# Patient Record
Sex: Female | Born: 1980
Health system: Southern US, Community
[De-identification: ages and names within clinical notes are randomized; demographics above are authoritative.]

## PROBLEM LIST (undated history)

## (undated) DIAGNOSIS — N809 Endometriosis, unspecified: Secondary | ICD-10-CM

## (undated) DIAGNOSIS — Z8489 Family history of other specified conditions: Secondary | ICD-10-CM

## (undated) DIAGNOSIS — R001 Bradycardia, unspecified: Secondary | ICD-10-CM

## (undated) DIAGNOSIS — B2790 Infectious mononucleosis, unspecified without complication: Secondary | ICD-10-CM

## (undated) DIAGNOSIS — I959 Hypotension, unspecified: Secondary | ICD-10-CM

## (undated) DIAGNOSIS — I341 Nonrheumatic mitral (valve) prolapse: Secondary | ICD-10-CM

## (undated) DIAGNOSIS — F909 Attention-deficit hyperactivity disorder, unspecified type: Secondary | ICD-10-CM

## (undated) DIAGNOSIS — E042 Nontoxic multinodular goiter: Secondary | ICD-10-CM

## (undated) DIAGNOSIS — J45909 Unspecified asthma, uncomplicated: Secondary | ICD-10-CM

## (undated) DIAGNOSIS — E538 Deficiency of other specified B group vitamins: Secondary | ICD-10-CM

## (undated) DIAGNOSIS — J189 Pneumonia, unspecified organism: Secondary | ICD-10-CM

## (undated) DIAGNOSIS — R768 Other specified abnormal immunological findings in serum: Secondary | ICD-10-CM

## (undated) DIAGNOSIS — R7689 Other specified abnormal immunological findings in serum: Secondary | ICD-10-CM

## (undated) HISTORY — DX: Endometriosis, unspecified: N80.9

## (undated) HISTORY — DX: Infectious mononucleosis, unspecified without complication: B27.90

## (undated) HISTORY — PX: ENDOMETRIAL ABLATION: SHX621

## (undated) HISTORY — DX: Other specified abnormal immunological findings in serum: R76.89

## (undated) HISTORY — DX: Other specified abnormal immunological findings in serum: R76.8

---

## 2017-05-06 HISTORY — PX: PARTIAL HYSTERECTOMY: SHX80

## 2017-05-16 DIAGNOSIS — N809 Endometriosis, unspecified: Secondary | ICD-10-CM | POA: Insufficient documentation

## 2017-07-06 HISTORY — PX: NECK SURGERY: SHX720

## 2017-10-06 HISTORY — PX: APPENDECTOMY: SHX54

## 2017-10-06 HISTORY — PX: EXCISION OF ENDOMETRIOMA: SHX6473

## 2018-01-09 ENCOUNTER — Other Ambulatory Visit: Payer: Self-pay

## 2018-01-09 ENCOUNTER — Emergency Department (HOSPITAL_COMMUNITY): Admission: EM | Admit: 2018-01-09 | Discharge: 2018-01-09 | Discharge status: Against Medical Advice | Payer: 59

## 2018-01-09 NOTE — ED Notes (Signed)
Patient seen walking out lobby doors towards parking lot.

## 2018-11-17 ENCOUNTER — Encounter: Payer: Self-pay | Admitting: Internal Medicine

## 2019-05-15 ENCOUNTER — Encounter: Payer: Self-pay | Admitting: Endocrinology

## 2019-05-15 ENCOUNTER — Ambulatory Visit: Payer: BC Managed Care – PPO | Admitting: Endocrinology

## 2019-05-15 DIAGNOSIS — E559 Vitamin D deficiency, unspecified: Secondary | ICD-10-CM | POA: Diagnosis not present

## 2019-05-15 DIAGNOSIS — M542 Cervicalgia: Secondary | ICD-10-CM | POA: Diagnosis not present

## 2019-05-15 DIAGNOSIS — E042 Nontoxic multinodular goiter: Secondary | ICD-10-CM | POA: Insufficient documentation

## 2019-05-15 DIAGNOSIS — E069 Thyroiditis, unspecified: Secondary | ICD-10-CM | POA: Diagnosis not present

## 2019-05-15 NOTE — Patient Instructions (Addendum)
Please sign a release of information from [place].   Blood tests are requested for you today.  We'll let you know about the results.  Please see an ENT specialist.  you will receive a phone call, about a day and time for an appointment Let's recheck the ultrasound.  you will receive a phone call, about a day and time for an appointment, for this, also.

## 2019-05-15 NOTE — Progress Notes (Signed)
Subjective:    Patient ID: Isabella Singh, female    DOB: [birth year], 38 y.o.   MRN: [id number]  HPI Pt is referred by Dr Gevena Barre, for hypothyroidism.  Pt reports thyroiditis was found on a neck CT in 2019.  she has never been on prescribed thyroid hormone therapy.  she has never taken kelp or any other type of non-prescribed thyroid product.  She is not considering a pregnancy.  He has never had thyroid surgery, or XRT to the neck.  He has never been on amiodarone or lithium.   Pt was noted to have hypocalcemia in 2019.  A recheck was normal.  She takes vit-d, 7000-10,000 units/day.  She denies h/o the following: bariatric surgery, renal disease, seizures, pancreatitis, heart disease, cancer, osteoporosis, malabsorption, eating disorder, bony fx, ant neck surgery, and chelation rx.  She has moderately dry hair, and assoc fatigue.   She also says she was checked for a painful nodule at the left lat neck.  She requests f/u Past Medical History:  Diagnosis Date  . Elevated antinuclear antibody (ANA) level   . Endometriosis    sx 2008 (x1), 2018 (x2)  . [facility] Patient infection    hx    Past Surgical History:  Procedure Laterality Date  . APPENDECTOMY  10/2017  . EXCISION OF ENDOMETRIOMA  10/2017  . NECK SURGERY  07/2017   c5 c6  . PARTIAL HYSTERECTOMY  05/2017    Social History   Socioeconomic History  . Marital status: Married    Spouse name: Not on file  . Number of children: Not on file  . Years of education: Not on file  . Highest education level: Not on file  Occupational History  . Occupation: Electrical engineer  Social Needs  . Financial resource strain: Not on file  . Food insecurity:    Worry: Not on file    Inability: Not on file  . Transportation needs:    Medical: Not on file    Non-medical: Not on file  Tobacco Use  . Smoking status: Never Smoker  . Smokeless tobacco: Never Used  Substance and Sexual Activity  . Alcohol use: Never    Frequency: Never  . Drug  use: Never  . Sexual activity: Not on file  Lifestyle  . Physical activity:    Days per week: Not on file    Minutes per session: Not on file  . Stress: Not on file  Relationships  . Social connections:    Talks on phone: Not on file    Gets together: Not on file    Attends religious service: Not on file    Active member of club or organization: Not on file    Attends meetings of clubs or organizations: Not on file    Relationship status: Not on file  . Intimate partner violence:    Fear of current or ex partner: Not on file    Emotionally abused: Not on file    Physically abused: Not on file    Forced sexual activity: Not on file  Other Topics Concern  . Not on file  Social History Narrative  . Not on file    Current Outpatient Medications on File Prior to Visit  Medication Sig Dispense Refill  . ibuprofen (ADVIL) 600 MG tablet Prn    . UNABLE TO FIND Med Name: Daily Wellbeing for Women - 2 daily    . UNABLE TO FIND Med Name: Bone Strength - 2 daily    .  UNABLE TO FIND Med Name: l-glutathione 100 - 2 daily     No current facility-administered medications on file prior to visit.     Allergies  Allergen Reactions  . Cyclobenzaprine Rash  . Sulfa Antibiotics     Family History  Problem Relation Age of Onset  . Thyroid disease Neg Hx     BP 110/70 (BP Location: Left Arm, Patient Position: Sitting, Cuff Size: Normal)   Pulse 72   Temp 98.3 F (36.8 C) (Oral)   Wt 157 lb (71.2 kg)   SpO2 97%     Review of Systems She has generalized weakness.  denies depression, muscle cramps, sob, constipation, blurry vision, myalgias, rhinorrhea, easy bruising, and syncope.  She has chronic slight pain at the left lat neck.  She has weight gain, cold intolerance, foot numbness, dry skin, and difficulty with concentration.      Objective:   Physical Exam VS: see vs page GEN: no distress HEAD: head: no deformity eyes: no periorbital swelling, no proptosis external nose and  ears are normal mouth: no lesion seen.   NECK: healed scar is present (C-spine procedure).  thyroid is slightly enlarged, with irreg surface, but no palpable nodule.   CHEST WALL: no deformity LUNGS: clear to auscultation CV: reg rate and rhythm, no murmur ABD: abdomen is soft, nontender.  no hepatosplenomegaly.  not distended.  no hernia MUSCULOSKELETAL: muscle bulk and strength are grossly normal.  no obvious joint swelling.  gait is normal and steady EXTEMITIES: no deformity.  no ulcer on the feet.  feet are of normal color and temp.  no edema PULSES: dorsalis pedis intact bilat.  no carotid bruit NEURO:  cn 2-12 grossly intact.   readily moves all 4's.  sensation is intact to touch on the feet SKIN:  Normal texture and temperature.  No rash or suspicious lesion is visible.   NODES:  None palpable at the neck PSYCH: alert, well-oriented.  Does not appear anxious nor depressed.  outside test results are reviewed: [place] (2019): heterogeneous thyroid but no nodule  08/15/18:  25-OH vit-D=41  PTH=19 TSH=0.64  I have reviewed outside records, and summarized: Pt was noted to have abnormal thyroid on [place], and referred to endocrinologist.  She was also noted to have hypocalcemia.  Biotin was stopped      Assessment & Plan:  Thyroiditis, per [place], new to me.  She is at risk for abnormal thyroid function.  Therefore, she should have annual TFT and PE of the neck Hypocalcemia, uncertain etiology.  Neck nodule, as reported by pt.    Patient Instructions  Please sign a release of information from [place].   Blood tests are requested for you today.  We'll let you know about the results.  Please see an ENT specialist.  you will receive a phone call, about a day and time for an appointment Let's recheck the ultrasound.  you will receive a phone call, about a day and time for an appointment, for this, also.

## 2019-05-16 ENCOUNTER — Encounter: Payer: Self-pay | Admitting: Endocrinology

## 2019-05-16 DIAGNOSIS — R5382 Chronic fatigue, unspecified: Secondary | ICD-10-CM | POA: Diagnosis not present

## 2019-05-16 DIAGNOSIS — M2550 Pain in unspecified joint: Secondary | ICD-10-CM | POA: Diagnosis not present

## 2019-05-16 DIAGNOSIS — D8989 Other specified disorders involving the immune mechanism, not elsewhere classified: Secondary | ICD-10-CM | POA: Diagnosis not present

## 2019-05-16 LAB — BASIC METABOLIC PANEL
BUN: 13 mg/dL (ref 6–23)
CO2: 22 mEq/L (ref 19–32)
Calcium: 9.5 mg/dL (ref 8.4–10.5)
Chloride: 106 mEq/L (ref 96–112)
Creatinine, Ser: 0.71 mg/dL (ref 0.40–1.20)
GFR: 91.68 mL/min (ref >60.00)
Glucose, Bld: 95 mg/dL (ref 70–99)
Potassium: 3.9 mEq/L (ref 3.5–5.1)
Sodium: 138 mEq/L (ref 135–145)

## 2019-05-16 LAB — TSH: TSH: 0.73 u[IU]/mL (ref 0.35–4.50)

## 2019-05-16 LAB — T3, FREE: T3, Free: 3.4 pg/mL (ref 2.3–4.2)

## 2019-05-16 LAB — T4, FREE: Free T4: 0.72 ng/dL (ref 0.60–1.60)

## 2019-05-16 LAB — VITAMIN D 25 HYDROXY (VIT D DEFICIENCY, FRACTURES): VITD: 58.24 ng/mL (ref 30.00–100.00)

## 2019-05-17 ENCOUNTER — Other Ambulatory Visit: Payer: Self-pay | Admitting: Endocrinology

## 2019-05-17 NOTE — Telephone Encounter (Signed)
FYI

## 2019-05-21 ENCOUNTER — Other Ambulatory Visit: Payer: Self-pay | Admitting: Internal Medicine

## 2019-05-21 ENCOUNTER — Encounter: Payer: Self-pay | Admitting: Endocrinology

## 2019-05-21 ENCOUNTER — Other Ambulatory Visit: Payer: Self-pay

## 2019-05-22 LAB — PTH, INTACT AND CALCIUM
Calcium: 9.6 mg/dL (ref 8.6–10.2)
PTH: 8 pg/mL — ABNORMAL LOW (ref 14–64)

## 2019-05-22 LAB — TEST AUTHORIZATION

## 2019-05-22 LAB — THYROID STIMULATING IMMUNOGLOBULIN: TSI: <89 % baseline (ref <140)

## 2019-05-22 LAB — EXTRA SPECIMEN

## 2019-05-22 LAB — ALBUMIN: Albumin: 4.5 g/dL (ref 3.6–5.1)

## 2019-05-22 LAB — THYROID PEROXIDASE ANTIBODY: Thyroperoxidase Ab SerPl-aCnc: 1 IU/mL (ref <9)

## 2019-05-23 ENCOUNTER — Other Ambulatory Visit: Payer: Self-pay

## 2019-05-29 ENCOUNTER — Ambulatory Visit
Admission: RE | Admit: 2019-05-29 | Discharge: 2019-05-29 | Disposition: A | Discharge status: Home / Self Care | Payer: BC Managed Care – PPO | Source: Ambulatory Visit | Attending: Endocrinology | Admitting: Endocrinology

## 2019-05-29 DIAGNOSIS — E041 Nontoxic single thyroid nodule: Secondary | ICD-10-CM | POA: Diagnosis not present

## 2019-05-29 DIAGNOSIS — E042 Nontoxic multinodular goiter: Secondary | ICD-10-CM

## 2019-05-30 DIAGNOSIS — R531 Weakness: Secondary | ICD-10-CM | POA: Diagnosis not present

## 2019-05-30 DIAGNOSIS — N809 Endometriosis, unspecified: Secondary | ICD-10-CM | POA: Diagnosis not present

## 2019-05-30 DIAGNOSIS — R202 Paresthesia of skin: Secondary | ICD-10-CM | POA: Diagnosis not present

## 2019-05-30 DIAGNOSIS — M5440 Lumbago with sciatica, unspecified side: Secondary | ICD-10-CM | POA: Diagnosis not present

## 2019-06-05 ENCOUNTER — Other Ambulatory Visit: Payer: Self-pay | Admitting: Endocrinology

## 2019-06-05 ENCOUNTER — Other Ambulatory Visit (INDEPENDENT_AMBULATORY_CARE_PROVIDER_SITE_OTHER): Payer: BC Managed Care – PPO

## 2019-06-05 ENCOUNTER — Other Ambulatory Visit (HOSPITAL_COMMUNITY): Payer: Self-pay | Admitting: Otolaryngology

## 2019-06-05 ENCOUNTER — Other Ambulatory Visit: Payer: Self-pay

## 2019-06-05 ENCOUNTER — Other Ambulatory Visit: Payer: Self-pay | Admitting: Otolaryngology

## 2019-06-05 DIAGNOSIS — R1312 Dysphagia, oropharyngeal phase: Secondary | ICD-10-CM | POA: Diagnosis not present

## 2019-06-05 DIAGNOSIS — K1123 Chronic sialoadenitis: Secondary | ICD-10-CM | POA: Diagnosis not present

## 2019-06-05 DIAGNOSIS — R590 Localized enlarged lymph nodes: Secondary | ICD-10-CM | POA: Diagnosis not present

## 2019-06-05 DIAGNOSIS — K219 Gastro-esophageal reflux disease without esophagitis: Secondary | ICD-10-CM | POA: Diagnosis not present

## 2019-06-05 DIAGNOSIS — E559 Vitamin D deficiency, unspecified: Secondary | ICD-10-CM

## 2019-06-05 DIAGNOSIS — R1310 Dysphagia, unspecified: Secondary | ICD-10-CM

## 2019-06-06 ENCOUNTER — Other Ambulatory Visit (INDEPENDENT_AMBULATORY_CARE_PROVIDER_SITE_OTHER): Payer: BC Managed Care – PPO

## 2019-06-06 ENCOUNTER — Encounter: Payer: Self-pay | Admitting: Endocrinology

## 2019-06-06 DIAGNOSIS — E559 Vitamin D deficiency, unspecified: Secondary | ICD-10-CM | POA: Diagnosis not present

## 2019-06-06 DIAGNOSIS — M2653 Deviation in opening and closing of the mandible: Secondary | ICD-10-CM | POA: Diagnosis not present

## 2019-06-06 DIAGNOSIS — M26633 Articular disc disorder of bilateral temporomandibular joint: Secondary | ICD-10-CM | POA: Diagnosis not present

## 2019-06-06 DIAGNOSIS — M2669 Other specified disorders of temporomandibular joint: Secondary | ICD-10-CM | POA: Diagnosis not present

## 2019-06-06 LAB — BASIC METABOLIC PANEL
BUN: 13 mg/dL (ref 6–23)
CO2: 24 mEq/L (ref 19–32)
Calcium: 9.5 mg/dL (ref 8.4–10.5)
Chloride: 103 mEq/L (ref 96–112)
Creatinine, Ser: 0.77 mg/dL (ref 0.40–1.20)
GFR: 83.46 mL/min (ref >60.00)
Glucose, Bld: 80 mg/dL (ref 70–99)
Potassium: 4 mEq/L (ref 3.5–5.1)
Sodium: 137 mEq/L (ref 135–145)

## 2019-06-06 LAB — VITAMIN D 25 HYDROXY (VIT D DEFICIENCY, FRACTURES): VITD: 52.28 ng/mL (ref 30.00–100.00)

## 2019-06-06 LAB — PTH, INTACT AND CALCIUM
Calcium: 10 mg/dL (ref 8.6–10.2)
PTH: 9 pg/mL — ABNORMAL LOW (ref 14–64)

## 2019-06-06 LAB — ALBUMIN: Albumin: 4.6 g/dL (ref 3.5–5.2)

## 2019-06-06 NOTE — Telephone Encounter (Signed)
Please advise 

## 2019-06-11 ENCOUNTER — Ambulatory Visit (HOSPITAL_COMMUNITY)
Admission: RE | Admit: 2019-06-11 | Discharge: 2019-06-11 | Disposition: A | Discharge status: Home / Self Care | Payer: BC Managed Care – PPO | Source: Ambulatory Visit | Attending: Otolaryngology | Admitting: Otolaryngology

## 2019-06-11 ENCOUNTER — Other Ambulatory Visit (HOSPITAL_COMMUNITY): Payer: Self-pay | Admitting: Otolaryngology

## 2019-06-11 ENCOUNTER — Other Ambulatory Visit: Payer: Self-pay

## 2019-06-11 DIAGNOSIS — R1310 Dysphagia, unspecified: Secondary | ICD-10-CM | POA: Diagnosis not present

## 2019-06-11 IMAGING — RF ESOPHAGUS/BARIUM SWALLOW/TABLET STUDY
12 series · 15 of 24 positions shown · non-contrast
Comparison: None

CLINICAL DATA: Dysphagia particularly at cervical region,
occasionally coughs up undigested food specially fruits but no
stomach acid, history of prior cervical spine surgery and
thyroiditis with multinodular goiter JHILBER
infection

EXAM:
ESOPHOGRAM / BARIUM SWALLOW / BARIUM TABLET STUDY
TECHNIQUE: Combined double contrast and single contrast examination performed
using effervescent crystals, thick barium liquid, and thin barium
liquid. The patient was observed with fluoroscopy swallowing a 13 mm
barium sulphate tablet.
FLUOROSCOPY TIME:  Fluoroscopy Time:  1 minutes 18 seconds
Radiation Exposure Index (if provided by the fluoroscopic device):
15.0 mGy
Number of Acquired Spot Images: multiple fluoroscopic screen
captures

[Series 1: cp_standard · 0.17mm/px · 1 of 54 frames shown (1 of 12)]
[frame 9/54]
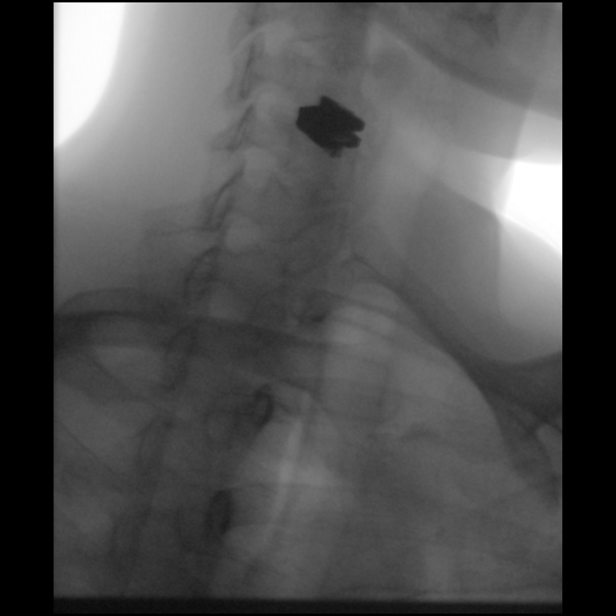

[Series 2: cp_standard · 0.18mm/px · 1 of 113 frames shown (2 of 12)]
[frame 3/113]
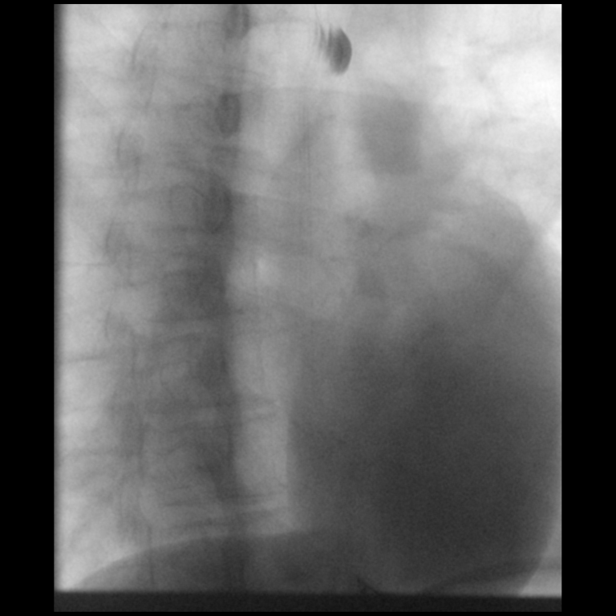

[Series 3: cp_standard · 0.18mm/px · 2 of 34 frames shown (3 of 12)]
[frame 6/34]
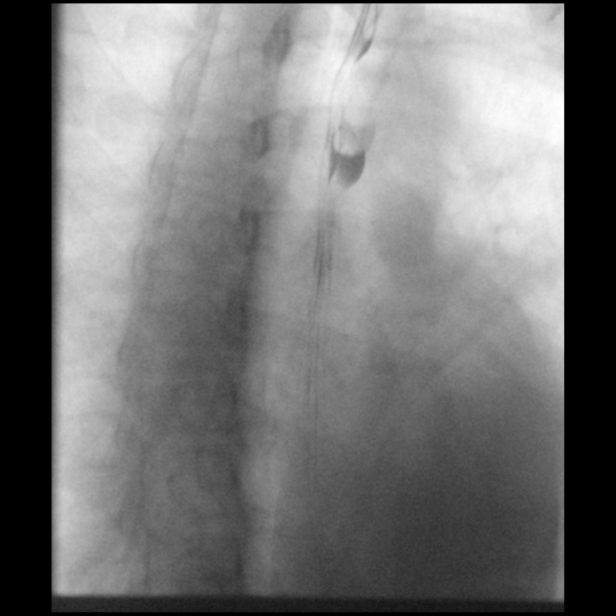
[frame 18/34]
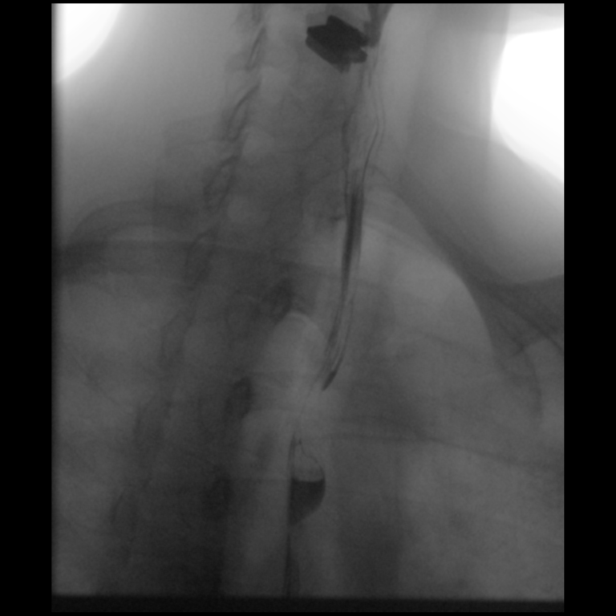

[Series 4: cp_standard · 0.17mm/px · 1 of 48 frames shown (4 of 12)]
[frame 41/48]
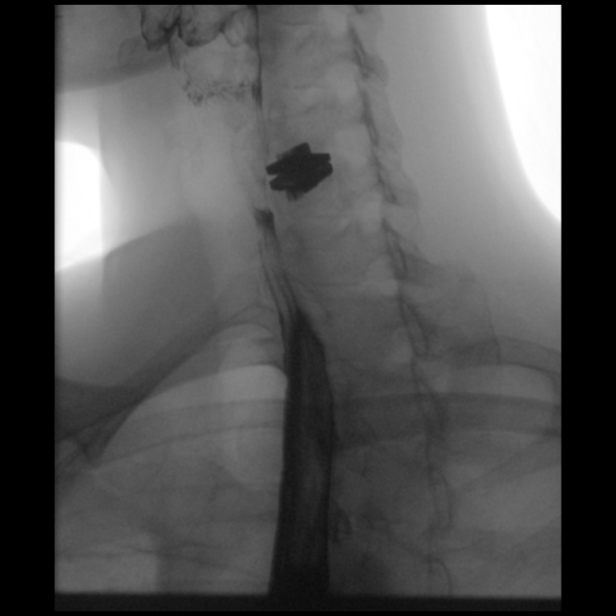

[Series 5: cp_standard · 0.18mm/px · 1 of 86 frames shown (5 of 12)]
[frame 27/86]
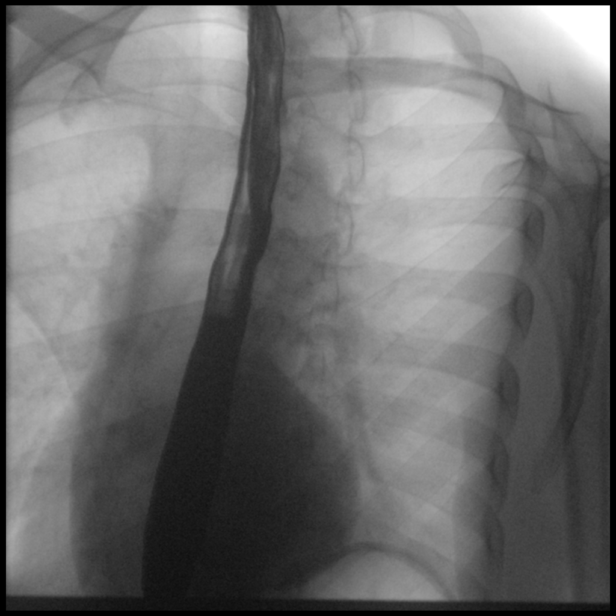

[Series 6: cp_standard · 0.26mm/px · 1 of 80 frames shown (6 of 12)]
[frame 13/80]
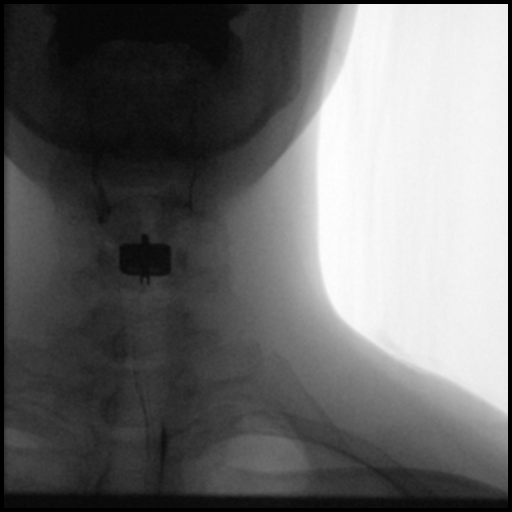

[Series 7: cp_standard · 0.26mm/px · 2 of 95 frames shown (7 of 12)]
[frame 44/95]
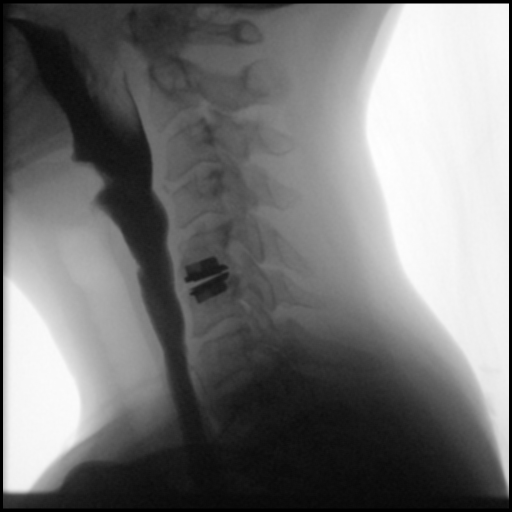
[frame 81/95]
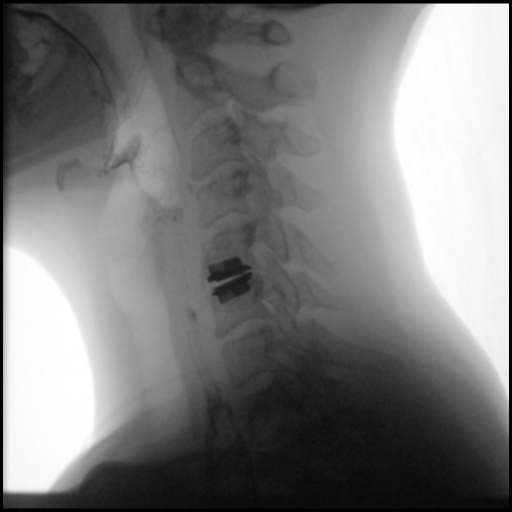

[Series 8: cp_standard · 0.17mm/px · 1 of 27 frames shown (8 of 12)]
[frame 27/27]
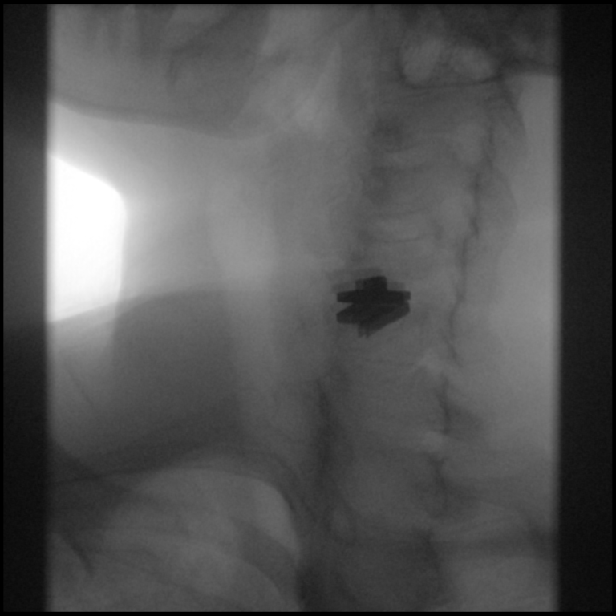

[Series 9: cp_standard · 0.17mm/px · 1 of 10 frames shown (9 of 12)]
[frame 5/10]
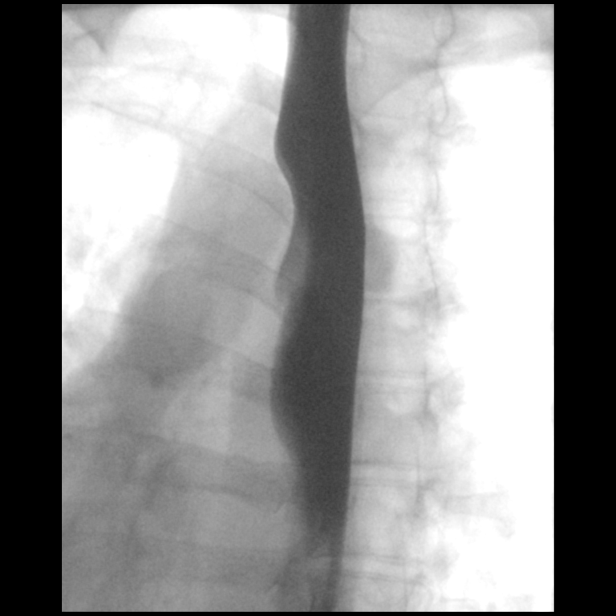

[Series 9: cp_standard · 0.17mm/px · 1 of 41 frames shown (10 of 12)]
[frame 16/41]
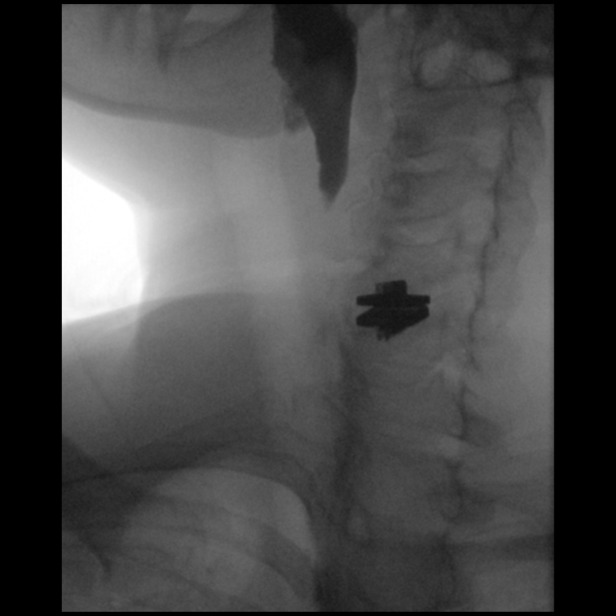

[Series 11: cp_standard · 0.17mm/px · 2 of 14 frames shown (11 of 12)]
[frame 7/14]
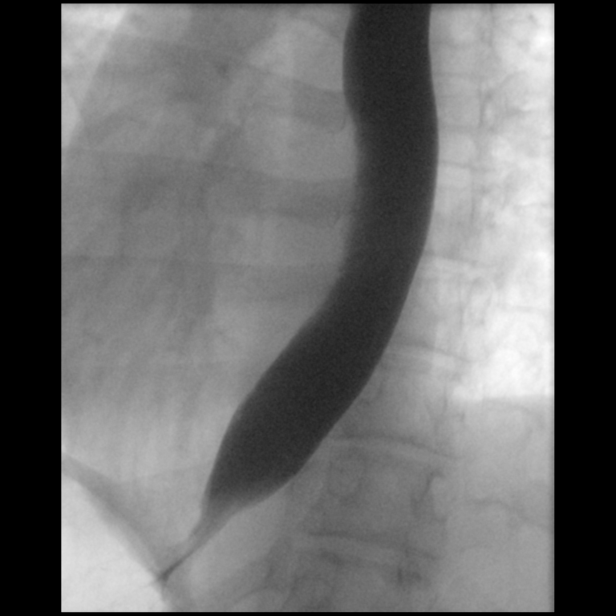
[frame 12/14]
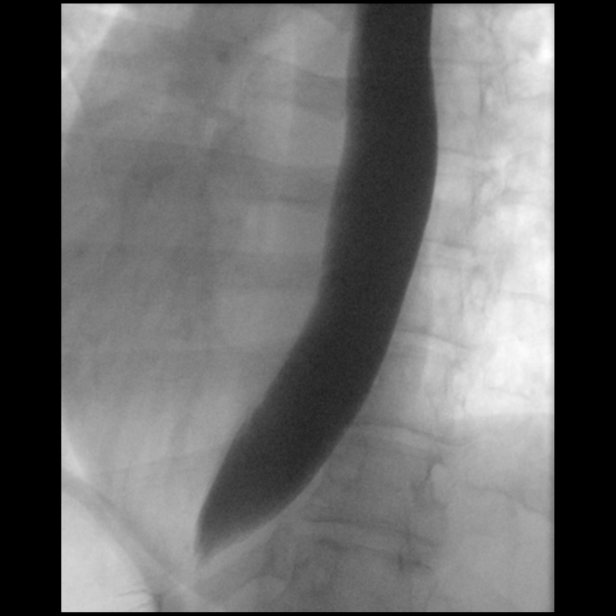

[Series 12: cp_standard · 0.18mm/px · 1 of 74 frames shown (12 of 12)]
[frame 70/74]
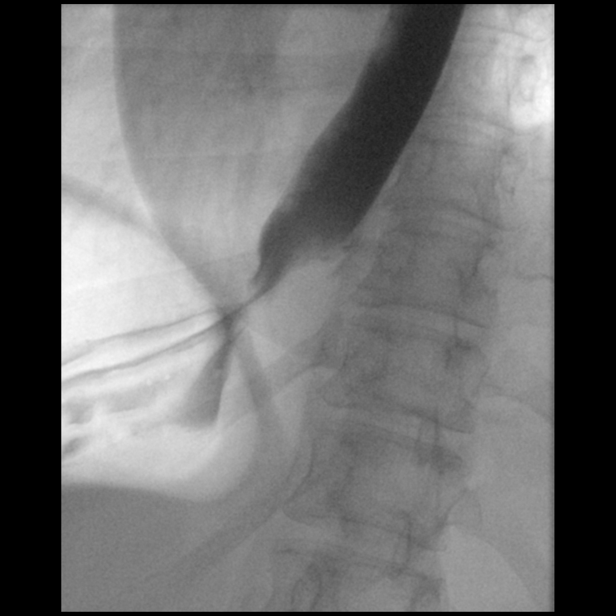

[15 of 24 positions shown; findings below may reference images not displayed]

FINDINGS: Esophageal distention: Normal distention without mass or stricture.

Filling defects:  None

12.5 mm barium tablet: Easily passed from oral cavity to stomach
without obstruction

Motility:  Normal

Mucosa: Smooth without irregularity or ulceration on air contrast
imaging

Hypopharynx/cervical esophagus: Normal motion without laryngeal
penetration or aspiration. No residuals. No Zenker diverticulum or
cricopharyngeal muscle enlargement.

Hiatal hernia:  Absent

GE reflux:  Not witnessed during exam

Other:  Disc prosthesis at C5-C6 disc space.
IMPRESSION: Normal exam.

## 2019-06-12 DIAGNOSIS — M47819 Spondylosis without myelopathy or radiculopathy, site unspecified: Secondary | ICD-10-CM | POA: Diagnosis not present

## 2019-06-20 DIAGNOSIS — N809 Endometriosis, unspecified: Secondary | ICD-10-CM | POA: Diagnosis not present

## 2019-06-20 DIAGNOSIS — R531 Weakness: Secondary | ICD-10-CM | POA: Diagnosis not present

## 2019-06-20 DIAGNOSIS — M5440 Lumbago with sciatica, unspecified side: Secondary | ICD-10-CM | POA: Diagnosis not present

## 2019-06-20 DIAGNOSIS — R202 Paresthesia of skin: Secondary | ICD-10-CM | POA: Diagnosis not present

## 2019-06-22 DIAGNOSIS — M6281 Muscle weakness (generalized): Secondary | ICD-10-CM | POA: Diagnosis not present

## 2019-06-22 DIAGNOSIS — R202 Paresthesia of skin: Secondary | ICD-10-CM | POA: Diagnosis not present

## 2019-07-09 ENCOUNTER — Other Ambulatory Visit: Payer: 59 | Admitting: Internal Medicine

## 2019-07-10 ENCOUNTER — Other Ambulatory Visit: Payer: Self-pay

## 2019-07-10 ENCOUNTER — Other Ambulatory Visit: Payer: BC Managed Care – PPO | Admitting: Internal Medicine

## 2019-07-10 DIAGNOSIS — Z1321 Encounter for screening for nutritional disorder: Secondary | ICD-10-CM | POA: Diagnosis not present

## 2019-07-10 DIAGNOSIS — Z1329 Encounter for screening for other suspected endocrine disorder: Secondary | ICD-10-CM | POA: Diagnosis not present

## 2019-07-10 DIAGNOSIS — Z0000 Encounter for general adult medical examination without abnormal findings: Secondary | ICD-10-CM | POA: Diagnosis not present

## 2019-07-10 DIAGNOSIS — Z13220 Encounter for screening for lipoid disorders: Secondary | ICD-10-CM | POA: Diagnosis not present

## 2019-07-11 DIAGNOSIS — R531 Weakness: Secondary | ICD-10-CM | POA: Diagnosis not present

## 2019-07-11 DIAGNOSIS — N809 Endometriosis, unspecified: Secondary | ICD-10-CM | POA: Diagnosis not present

## 2019-07-11 DIAGNOSIS — M5440 Lumbago with sciatica, unspecified side: Secondary | ICD-10-CM | POA: Diagnosis not present

## 2019-07-11 DIAGNOSIS — R202 Paresthesia of skin: Secondary | ICD-10-CM | POA: Diagnosis not present

## 2019-07-11 LAB — COMPLETE METABOLIC PANEL WITH GFR
AG Ratio: 1.7 (calc) (ref 1.0–2.5)
ALT: 12 U/L (ref 6–29)
AST: 13 U/L (ref 10–30)
Albumin: 4.3 g/dL (ref 3.6–5.1)
Alkaline phosphatase (APISO): 51 U/L (ref 31–125)
BUN: 17 mg/dL (ref 7–25)
CO2: 26 mmol/L (ref 20–32)
Calcium: 9.5 mg/dL (ref 8.6–10.2)
Chloride: 105 mmol/L (ref 98–110)
Creat: 0.78 mg/dL (ref 0.50–1.10)
GFR, Est African American: 111 mL/min/{1.73_m2} (ref >=60)
GFR, Est Non African American: 96 mL/min/{1.73_m2} (ref >=60)
Globulin: 2.6 g/dL (calc) (ref 1.9–3.7)
Glucose, Bld: 84 mg/dL (ref 65–99)
Potassium: 4.6 mmol/L (ref 3.5–5.3)
Sodium: 138 mmol/L (ref 135–146)
Total Bilirubin: 0.5 mg/dL (ref 0.2–1.2)
Total Protein: 6.9 g/dL (ref 6.1–8.1)

## 2019-07-11 LAB — CBC WITH DIFFERENTIAL/PLATELET
Absolute Monocytes: 429 cells/uL (ref 200–950)
Basophils Absolute: 67 cells/uL (ref 0–200)
Basophils Relative: 1 %
Eosinophils Absolute: 302 cells/uL (ref 15–500)
Eosinophils Relative: 4.5 %
HCT: 40.6 % (ref 35.0–45.0)
Hemoglobin: 13.7 g/dL (ref 11.7–15.5)
Lymphs Abs: 2111 cells/uL (ref 850–3900)
MCH: 31 pg (ref 27.0–33.0)
MCHC: 33.7 g/dL (ref 32.0–36.0)
MCV: 91.9 fL (ref 80.0–100.0)
MPV: 9.5 fL (ref 7.5–12.5)
Monocytes Relative: 6.4 %
Neutro Abs: 3792 cells/uL (ref 1500–7800)
Neutrophils Relative %: 56.6 %
Platelets: 353 10*3/uL (ref 140–400)
RBC: 4.42 10*6/uL (ref 3.80–5.10)
RDW: 11.9 % (ref 11.0–15.0)
Total Lymphocyte: 31.5 %
WBC: 6.7 10*3/uL (ref 3.8–10.8)

## 2019-07-11 LAB — LIPID PANEL
Cholesterol: 196 mg/dL (ref <200)
HDL: 59 mg/dL (ref >=50)
LDL Cholesterol (Calc): 119 mg/dL (calc) — ABNORMAL HIGH
Non-HDL Cholesterol (Calc): 137 mg/dL (calc) — ABNORMAL HIGH (ref <130)
Total CHOL/HDL Ratio: 3.3 (calc) (ref <5.0)
Triglycerides: 85 mg/dL (ref <150)

## 2019-07-11 LAB — TSH: TSH: 0.97 mIU/L

## 2019-07-16 ENCOUNTER — Other Ambulatory Visit: Payer: Self-pay

## 2019-07-16 ENCOUNTER — Encounter: Payer: Self-pay | Admitting: Internal Medicine

## 2019-07-16 ENCOUNTER — Encounter

## 2019-07-16 ENCOUNTER — Ambulatory Visit (INDEPENDENT_AMBULATORY_CARE_PROVIDER_SITE_OTHER): Payer: BC Managed Care – PPO | Admitting: Internal Medicine

## 2019-07-16 VITALS — BP 110/60 | HR 96 | Temp 98.3°F | Ht 66.0 in | Wt 159.0 lb

## 2019-07-16 DIAGNOSIS — Z8619 Personal history of other infectious and parasitic diseases: Secondary | ICD-10-CM

## 2019-07-16 DIAGNOSIS — F411 Generalized anxiety disorder: Secondary | ICD-10-CM | POA: Diagnosis not present

## 2019-07-16 DIAGNOSIS — E042 Nontoxic multinodular goiter: Secondary | ICD-10-CM

## 2019-07-16 DIAGNOSIS — M7918 Myalgia, other site: Secondary | ICD-10-CM | POA: Diagnosis not present

## 2019-07-16 DIAGNOSIS — Z0000 Encounter for general adult medical examination without abnormal findings: Secondary | ICD-10-CM

## 2019-07-16 DIAGNOSIS — F900 Attention-deficit hyperactivity disorder, predominantly inattentive type: Secondary | ICD-10-CM

## 2019-07-16 DIAGNOSIS — M26629 Arthralgia of temporomandibular joint, unspecified side: Secondary | ICD-10-CM

## 2019-07-16 DIAGNOSIS — R7989 Other specified abnormal findings of blood chemistry: Secondary | ICD-10-CM

## 2019-07-16 LAB — POCT URINALYSIS DIPSTICK
Appearance: NEGATIVE
Bilirubin, UA: NEGATIVE
Blood, UA: NEGATIVE
Glucose, UA: NEGATIVE
Ketones, UA: NEGATIVE
Leukocytes, UA: NEGATIVE
Nitrite, UA: NEGATIVE
Odor: NEGATIVE
Protein, UA: NEGATIVE
Spec Grav, UA: 1.01 (ref 1.010–1.025)
Urobilinogen, UA: 0.2 E.U./dL
pH, UA: 7.5 (ref 5.0–8.0)

## 2019-07-17 DIAGNOSIS — K1123 Chronic sialoadenitis: Secondary | ICD-10-CM | POA: Diagnosis not present

## 2019-07-17 DIAGNOSIS — K219 Gastro-esophageal reflux disease without esophagitis: Secondary | ICD-10-CM | POA: Diagnosis not present

## 2019-07-17 DIAGNOSIS — R1312 Dysphagia, oropharyngeal phase: Secondary | ICD-10-CM | POA: Diagnosis not present

## 2019-07-17 DIAGNOSIS — R590 Localized enlarged lymph nodes: Secondary | ICD-10-CM | POA: Diagnosis not present

## 2019-07-17 MED ORDER — AMPHETAMINE-DEXTROAMPHET ER 15 MG PO CP24: 15.0000 mg | ORAL_CAPSULE | ORAL | 0 refills | Status: DC

## 2019-07-21 NOTE — Progress Notes (Signed)
Subjective:    Patient ID: Isabella Singh, female    DOB: 1981-06-29, 38 y.o.   MRN: [id number]  HPI First visit for this 38 year old Female who formally resided in [place] but moved here a few months ago.   Her father is Isabella Singh who is also a patient in this practice.  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  Patient has a history of endometriosis.  She has had several surgery for endometriosis including a hysterectomy in June 2018, endometriosis excision appendectomy cystoscopy and vaginal cuff repair in November 2018.  In 2008 had endometriosis ablation and tubal cystectomy.  In August 2018 she had a cervical disc replaced C5-C6 by old records and osteophytes removed.    Review of Systems numerous complaints including bilateral leg weakness particularly going upstairs, burning in her shins if squatting, discomfort and fullness in the left neck area, inability to concentrate and says she has longstanding history of attention deficit disorder.  Apparently has history of TMJ symptoms.  Recently was placed on diclofenac and that seems to have helped his symptoms.  Was also given tizanidine to take at bedtime.  She  is beginning to feel better with those medications.     Additional history: Records from Dr. Gevena Barre, endocrinologist in [facility] whom she saw in December 2019 indicates she was seen there for hypocalcemia.  Extensive review of these records showed normal PTH, free T4, free T3, TSH.  TPO antibodies negative.  CT scan showed goiter.  Ultrasound confirmed goiter.  Apparently calcium obtained during evaluation in emergency department sometime in late 2019 was 6.5 with normal PTH.  Vitamin D was 40.  Was told that she had mononucleosis and a high ANA but no lupus.  Phosphorus was normal.  CT scan done in [facility] September 2019 showed postoperative and degenerative changes at C5-C6 with moderate right-sided neuroforaminal  stenosis.  Parotid glands were unremarkable.  A few nonenlarged cervical chain lymph nodes bilaterally.  Sinuses and orbits were unremarkable.  Subsequently had MRI of the brain because of complaint of headaches numbness and tingling in the arms.  This study was normal and was also done September 2019.  Subsequently had MRI of the C-spine because of tingling in the arms with some numbness.  C3-C4 showed moderate disc degeneration with loss of disc height and endplate osteophyte ridging mostly in the paracentral region flattening the ventral cord.  Moderate disc degeneration C6-C7 with loss of disc height central shallow protrusion indenting the ventral thecal sac.  No canal stenosis or cord compression.  Impression with normal cervical cord without focal nerve impingement or canal stenosis.  In May 2019 she had lab studies including a normal C met.  She had negative celiac disease panel.  Sed rate was 1.  ANA was positive.  Epstein-Barr titer IgG 141 consistent with previous infection.  ASO titer less than 20.  Rheumatoid factor less than 10.  C-reactive protein less than 0.1.  Sed rate was 1.  Bartonella titer less than 1:64  ANA titer reported as 1: 320.  Pattern reported as speckled and homogeneous.  She subsequently saw a neurologist here in [place] who did not think she had diabetes.  She saw Dr. Benjamine Mola, ENT physician who felt that the fullness in her left neck might be a parotid gland enlargement.  Also had recent evaluation at [place].  She says she was told that she did not have any significant rheumatology condition.  I am waiting to receive those records.  Labs done on August 4 here show a normal TSH, normal calcium at 9.5, normal electrolytes, normal liver functions. She had a normal lipid panel with the exception of an LDL of 119 on August 4.  She saw Dr. Loanne Drilling at [facility] in June.  She had thyroid ultrasound showing a number of mixed cystic and solid  nodules not meeting criteria for sampling and findings consistent with multinodular goiter.  She had a normal vitamin D level in June.  Her PTH was low at 8 her calcium was normal at 9.6. TPO antibodies were negative     Objective:   Physical Exam Skin warm and dry.  Nodes none.  She appears to be anxious and  talks almost nonstop today.  She has a fullness in her left cervical neck area that is vertical in about 3 cm long.  It is somewhat tender.  No thyromegaly.  Chest is clear.  Cardiac exam regular rate and rhythm normal S1 and S2.  No murmurs clicks or rubs appreciated.  Abdomen soft nondistended without hepatosplenomegaly masses or tenderness.  Muscle strength in the lower extremities is normal.  She is alert and oriented x3.  No gross focal deficits on brief neurological exam.  GYN exam deferred       Assessment & Plan:  Complex history with history of hypocalcemia and low PTH but now with normal calcium taking calcium supplements and vitamin D.  Vague myalgias and arthralgias.  Has been seen recently at [facility] rheumatology.  Records requested.  History of elevated ANA.  Remote history of Epstein-Barr virus infection  Goiter with negative TPO antibodies and normal TSH  Anxiety and possibly depression  Attention deficit disorder-start Adderall XR 15 mg daily  Plan: I will review records in detail that she brought with her from [place].  I need records from [facility] rheumatology before we pursue any further.  It may benefit her to have an MRI of her neck to try to resolve the fullness and pain issues there.  She will start Adderall XR 15 mg daily to see if this will help her concentration in the way she feels.  It is okay to continue with tizanidine and Voltaren for musculoskeletal pain.  60 minutes spent with patient and another 60 minutes spent reviewing records in detail.

## 2019-07-21 NOTE — Patient Instructions (Signed)
Continue diclofenac.  Records will be reviewed in detail and follow-up in 2 weeks.  Records requested from [facility] rheumatology.  Start Adderall X are 15 mg daily for attention deficit disorder.

## 2019-07-27 ENCOUNTER — Other Ambulatory Visit: Payer: Self-pay | Admitting: Otolaryngology

## 2019-07-27 DIAGNOSIS — D440 Neoplasm of uncertain behavior of thyroid gland: Secondary | ICD-10-CM

## 2019-07-27 DIAGNOSIS — R221 Localized swelling, mass and lump, neck: Secondary | ICD-10-CM

## 2019-07-30 ENCOUNTER — Ambulatory Visit: Payer: BC Managed Care – PPO | Admitting: Internal Medicine

## 2019-07-30 ENCOUNTER — Encounter: Payer: Self-pay | Admitting: Internal Medicine

## 2019-07-30 ENCOUNTER — Other Ambulatory Visit: Payer: Self-pay

## 2019-07-30 VITALS — BP 110/80 | HR 85 | Temp 98.5°F | Ht 66.0 in | Wt 156.0 lb

## 2019-07-30 DIAGNOSIS — M7918 Myalgia, other site: Secondary | ICD-10-CM

## 2019-07-30 DIAGNOSIS — F900 Attention-deficit hyperactivity disorder, predominantly inattentive type: Secondary | ICD-10-CM

## 2019-07-30 DIAGNOSIS — M791 Myalgia, unspecified site: Secondary | ICD-10-CM

## 2019-07-30 DIAGNOSIS — M26629 Arthralgia of temporomandibular joint, unspecified side: Secondary | ICD-10-CM

## 2019-07-30 DIAGNOSIS — F988 Other specified behavioral and emotional disorders with onset usually occurring in childhood and adolescence: Secondary | ICD-10-CM | POA: Insufficient documentation

## 2019-07-30 DIAGNOSIS — F419 Anxiety disorder, unspecified: Secondary | ICD-10-CM

## 2019-07-30 NOTE — Progress Notes (Signed)
   Subjective:    Patient ID: Isabella Singh, female    DOB: Dec 23, 1980, 38 y.o.   MRN: [id number]  HPI 38 year old Female in for follow up on multiple issues.  I have reviewed records in detail.  At last visit we started her on Adderall XR 15 mg daily and she is feeling much better in terms of energy and able to concentrate.  I agree with Dr. Cordelia Pen treatment of her hypocalcemia.  She says she would like to see a cardiologist regarding mitral valve prolapse.  I explained to her that mitral valve prolapse is not a serious condition.  Says she has some chest pain from time to time.  I think we should do further cardiology evaluation for now.  She saw Dr. Benjamine Mola recently and he will be getting a scan of her neck to try to resolve her neck issues.  With regard to musculoskeletal pain and jaw pain she is feeling better with Voltaren and I think she can continue with that.  I could not find an anti-DNA or a CCP in her records.  She has a history of ANA positive with titer of 1: 320.  I would like to see an anti-Smith antibody and anti-DNA antibody repeat ANA and a CCP.  Hopefully we can progress to a conclusion.  Rheumatologist did not think she needed to be treated at this point in time.  I think patient has a lot of anxiety about her health and needs reassurance.    Review of Systems see above     Objective:   Physical Exam  Not examined today but spent 20 minutes speaking with her about all of these issues and explaining how it took some time to sort all of this out      Assessment & Plan:  Attention deficit hyperactivity disorder-doing better with Adderall XR 15 mg daily and will continue with that  Musculoskeletal pain and jaw issues-TMJ continue with Voltaren  Recommend exercise which will help with anxiety and with musculoskeletal pain  Follow-up with ENT physician regarding neck issues  Continue treatment for hypocalcemia per Dr. Loanne Drilling  Labs drawn and pending today  include ANA, anti-Smith antibody anti-DNA and CCP.  Follow-up in 3 months.

## 2019-07-30 NOTE — Patient Instructions (Signed)
Continue Adderall XR 15 mg daily.  Follow-up with Dr. Benjamine Mola.  Labs drawn and pending.  Return here in 3 months

## 2019-08-01 LAB — CYCLIC CITRUL PEPTIDE ANTIBODY, IGG: Cyclic Citrullin Peptide Ab: <16 UNITS

## 2019-08-01 LAB — ANTI-NUCLEAR AB-TITER (ANA TITER)
ANA TITER: 1:640 {titer} — ABNORMAL HIGH
ANA Titer 1: 1:160 {titer} — ABNORMAL HIGH

## 2019-08-01 LAB — ANTI-SMITH ANTIBODY: ENA SM Ab Ser-aCnc: <1 AI

## 2019-08-01 LAB — ANTI-DNA ANTIBODY, DOUBLE-STRANDED: ds DNA Ab: 1 IU/mL

## 2019-08-01 LAB — ANA: Anti Nuclear Antibody (ANA): POSITIVE — AB

## 2019-08-07 ENCOUNTER — Other Ambulatory Visit: Payer: Self-pay

## 2019-08-07 ENCOUNTER — Ambulatory Visit
Admission: RE | Admit: 2019-08-07 | Discharge: 2019-08-07 | Disposition: A | Discharge status: Home / Self Care | Payer: BC Managed Care – PPO | Source: Ambulatory Visit | Attending: Otolaryngology | Admitting: Otolaryngology

## 2019-08-07 DIAGNOSIS — R221 Localized swelling, mass and lump, neck: Secondary | ICD-10-CM

## 2019-08-07 DIAGNOSIS — D440 Neoplasm of uncertain behavior of thyroid gland: Secondary | ICD-10-CM

## 2019-08-07 MED ORDER — IOPAMIDOL (ISOVUE-300) INJECTION 61%
75.0000 mL | Freq: Once | INTRAVENOUS | Status: AC | PRN
  Administered 2019-08-07: 75 mL via INTRAVENOUS

## 2019-08-10 ENCOUNTER — Telehealth: Payer: Self-pay | Admitting: Internal Medicine

## 2019-08-10 ENCOUNTER — Encounter: Payer: Self-pay | Admitting: Internal Medicine

## 2019-08-10 MED ORDER — AMPHETAMINE-DEXTROAMPHET ER 15 MG PO CP24: 15.0000 mg | ORAL_CAPSULE | ORAL | 0 refills | Status: DC

## 2019-08-10 NOTE — Telephone Encounter (Signed)
Refill Adderall XR 15 mg daily #30

## 2019-08-14 ENCOUNTER — Telehealth: Payer: Self-pay | Admitting: Internal Medicine

## 2019-08-14 NOTE — Telephone Encounter (Signed)
Faxed Referral, Demographics, Labs and office notes to Dr Rosette Reveal at [facility] Rheumatology phone number [phone] and fax number [phone]

## 2019-08-23 ENCOUNTER — Other Ambulatory Visit: Payer: Self-pay | Admitting: Endocrinology

## 2019-08-23 ENCOUNTER — Encounter: Payer: Self-pay | Admitting: Endocrinology

## 2019-08-23 ENCOUNTER — Other Ambulatory Visit: Payer: Self-pay

## 2019-08-23 ENCOUNTER — Other Ambulatory Visit (INDEPENDENT_AMBULATORY_CARE_PROVIDER_SITE_OTHER): Payer: BC Managed Care – PPO

## 2019-08-23 DIAGNOSIS — E559 Vitamin D deficiency, unspecified: Secondary | ICD-10-CM

## 2019-08-23 DIAGNOSIS — E069 Thyroiditis, unspecified: Secondary | ICD-10-CM

## 2019-08-23 LAB — BASIC METABOLIC PANEL
BUN: 15 mg/dL (ref 6–23)
CO2: 26 mEq/L (ref 19–32)
Calcium: 9.7 mg/dL (ref 8.4–10.5)
Chloride: 103 mEq/L (ref 96–112)
Creatinine, Ser: 0.83 mg/dL (ref 0.40–1.20)
GFR: 76.46 mL/min (ref >60.00)
Glucose, Bld: 87 mg/dL (ref 70–99)
Potassium: 4.2 mEq/L (ref 3.5–5.1)
Sodium: 137 mEq/L (ref 135–145)

## 2019-08-23 LAB — VITAMIN D 25 HYDROXY (VIT D DEFICIENCY, FRACTURES): VITD: 42.83 ng/mL (ref 30.00–100.00)

## 2019-08-23 LAB — T4, FREE: Free T4: 0.83 ng/dL (ref 0.60–1.60)

## 2019-08-23 LAB — TSH: TSH: 1 u[IU]/mL (ref 0.35–4.50)

## 2019-08-23 NOTE — Telephone Encounter (Signed)
FYI

## 2019-08-23 NOTE — Telephone Encounter (Signed)
Please advise 

## 2019-08-23 NOTE — Telephone Encounter (Signed)
Please Advise, Thanks

## 2019-08-24 LAB — PTH, INTACT AND CALCIUM
Calcium: 10 mg/dL (ref 8.6–10.2)
PTH: 9 pg/mL — ABNORMAL LOW (ref 14–64)

## 2019-08-28 ENCOUNTER — Telehealth: Payer: BC Managed Care – PPO

## 2019-08-29 ENCOUNTER — Ambulatory Visit (INDEPENDENT_AMBULATORY_CARE_PROVIDER_SITE_OTHER)
Admission: RE | Admit: 2019-08-29 | Discharge: 2019-08-29 | Disposition: A | Discharge status: Home / Self Care | Payer: BC Managed Care – PPO | Source: Ambulatory Visit

## 2019-08-29 ENCOUNTER — Other Ambulatory Visit: Payer: Self-pay

## 2019-08-29 DIAGNOSIS — R0982 Postnasal drip: Secondary | ICD-10-CM

## 2019-08-29 DIAGNOSIS — J4530 Mild persistent asthma, uncomplicated: Secondary | ICD-10-CM

## 2019-08-29 DIAGNOSIS — B349 Viral infection, unspecified: Secondary | ICD-10-CM | POA: Diagnosis not present

## 2019-08-29 DIAGNOSIS — Z20822 Contact with and (suspected) exposure to covid-19: Secondary | ICD-10-CM

## 2019-08-29 DIAGNOSIS — R0789 Other chest pain: Secondary | ICD-10-CM

## 2019-08-29 DIAGNOSIS — R6889 Other general symptoms and signs: Secondary | ICD-10-CM | POA: Diagnosis not present

## 2019-08-29 MED ORDER — ALBUTEROL SULFATE HFA 108 (90 BASE) MCG/ACT IN AERS: 1.0000 | INHALATION_SPRAY | Freq: Four times a day (QID) | RESPIRATORY_TRACT | 0 refills | Status: DC | PRN

## 2019-08-29 NOTE — Discharge Instructions (Addendum)
[place]: [facility] (Visitor Entrance), [address], [place], [place]: [address], [place], [place], [place] (entrance off [address])  [place] (Closed each Monday): [address] [place], [place] - the short stay covered drive at [facility] (Use the [employer] entrance to [facility] next to [facility].)   We will manage this as a viral syndrome. For sore throat or cough try using a honey-based tea. Use 3 teaspoons of honey with juice squeezed from half lemon. Place shaved pieces of ginger into 1/2-1 cup of water and warm over stove top. Then mix the ingredients and repeat every 4 hours as needed. Hydrate very well with at least 2 liters of water. Eat light meals such as soups to replenish electrolytes and soft fruits, veggies. Start an antihistamine like Zyrtec, Allegra or Claritin for postnasal drainage, sinus congestion.  You can take this together with pseudoephedrine (Sudafed) at a dose of 30 mg 3 times a day as needed for the same kind of congestion.

## 2019-08-29 NOTE — ED Provider Notes (Signed)
Virtual Visit via Video Note:  Isabella Singh  initiated request for Telemedicine visit with [facility]gent [facility] team. I connected with Isabella Singh  on 08/29/2019 at 10:03 AM  for a synchronized telemedicine visit using a video enabled HIPPA compliant telemedicine application. I verified that I am speaking with Isabella Singh  using two identifiers. Jaynee Eagles, PA-C  was physically located in a [facility] Urgent care site and Isabella Singh was located at a different location.   The limitations of evaluation and management by telemedicine as well as the availability of in-person appointments were discussed. Patient was informed that she  may incur a bill ( including co-pay) for this virtual visit encounter. Isabella Singh  expressed understanding and gave verbal consent to proceed with virtual visit.   History of Present Illness:Isabella Singh  is a 38 y.o. female presents with 5 day history of aspirating some food (brownie). The next day she felt persistent intermittent mild-moderate chest discomfort and constant tickle in her throat, was trying to cough and clear up her lungs. She also used Mucinex. Now she has felt like she is losing her voice. Her daughter ended up getting a viral URI, had stuffy nose, fever; COVID testing was negative for her.  Of note, patient has her own pulse oximeter and has generally been 96-98% but saw transient decreases to 94%. Has a history of asthma, has been using her mother's inhaler which helped. Of note, patient also spent a lot of time this past weekend doing yard work and is very allergic. She is not using any antihistamines. Denies smoking cigarettes.   Review of Systems  Constitutional: Positive for malaise/fatigue. Negative for fever.  HENT: Negative for congestion, ear pain, sinus pain and sore throat.   Eyes: Negative for blurred vision, double vision, discharge and redness.  Respiratory: Positive for cough. Negative for hemoptysis, shortness of breath and  wheezing.   Cardiovascular: Positive for chest pain (discomfort).  Gastrointestinal: Negative for abdominal pain, diarrhea, nausea and vomiting.  Genitourinary: Negative for dysuria, flank pain and hematuria.  Musculoskeletal: Negative for myalgias.  Skin: Negative for rash.  Neurological: Negative for dizziness, weakness and headaches.  Psychiatric/Behavioral: Negative for depression and substance abuse.    No current facility-administered medications for this encounter.    Current Outpatient Medications  Medication Sig Dispense Refill  . amphetamine-dextroamphetamine (ADDERALL XR) 15 MG 24 hr capsule Take 1 capsule by mouth every morning. 30 capsule 0  . diclofenac (VOLTAREN) 75 MG EC tablet Take 75 mg by mouth 2 (two) times daily.    Marland Kitchen tiZANidine (ZANAFLEX) 2 MG tablet Take 2 mg by mouth at bedtime.    Marland Kitchen UNABLE TO FIND Med Name: Daily Wellbeing for Women - 2 daily       Allergies  Allergen Reactions  . Cyclobenzaprine Rash  . Sulfa Antibiotics      Past Medical History:  Diagnosis Date  . Elevated antinuclear antibody (ANA) level   . Endometriosis    sx 2008 (x1), 2018 (x2)  . Randell Patient infection    hx    Past Surgical History:  Procedure Laterality Date  . APPENDECTOMY  10/2017  . EXCISION OF ENDOMETRIOMA  10/2017  . NECK SURGERY  07/2017   c5 c6  . PARTIAL HYSTERECTOMY  05/2017    Family History  Problem Relation Age of Onset  . Allergies Mother   . Arthritis Mother   . Asthma Mother   . Headache Mother   .  Headache Father   . Headache Sister   . Allergies Sister   . Arthritis Sister   . Allergies Brother   . Headache Brother   . Stroke Maternal Grandmother   . Stroke Paternal Grandmother   . Tuberculosis Other   . Thyroid disease Neg Hx     Social History   Tobacco Use  . Smoking status: Never Smoker  . Smokeless tobacco: Never Used  Substance Use Topics  . Alcohol use: Never    Frequency: Never  . Drug use: Never        Observations/Objective: Physical Exam Constitutional:      General: She is not in acute distress.    Appearance: Normal appearance. She is well-developed. She is not ill-appearing, toxic-appearing or diaphoretic.  Eyes:     Extraocular Movements: Extraocular movements intact.  Pulmonary:     Effort: Pulmonary effort is normal.  Neurological:     General: No focal deficit present.     Mental Status: She is alert and oriented to person, place, and time.  Psychiatric:        Mood and Affect: Mood normal.        Behavior: Behavior normal.        Thought Content: Thought content normal.        Judgment: Judgment normal.      Assessment and Plan:  1. Viral syndrome   2. Mild persistent asthma without complication   3. Chest discomfort   4. Post-nasal drip    Will manage for viral illness likely the same as her daughter. Refilled her albuterol inhaler for supportive care in light of her asthma. Counseled patient on nature of COVID-19 including modes of transmission, diagnostic testing, management and supportive care.  Offered symptomatic relief. COVID 19 testing is pending. Patient will come in for chest x-ray if she worsens and will also consider ER depending on severity of her breathing due to the fact that we are unable to do breathing treatments at our clinic. Counseled patient on potential for adverse effects with medications prescribed/recommended today, ER and return-to-clinic precautions discussed, patient verbalized understanding.     Follow Up Instructions:    I discussed the assessment and treatment plan with the patient. The patient was provided an opportunity to ask questions and all were answered. The patient agreed with the plan and demonstrated an understanding of the instructions.   The patient was advised to call back or seek an in-person evaluation if the symptoms worsen or if the condition fails to improve as anticipated.  I provided 15 minutes of  non-face-to-face time during this encounter.    Jaynee Eagles, PA-C  08/29/2019 10:03 AM      Jaynee Eagles, PA-C 08/29/19 1017

## 2019-08-31 DIAGNOSIS — R768 Other specified abnormal immunological findings in serum: Secondary | ICD-10-CM | POA: Diagnosis not present

## 2019-08-31 DIAGNOSIS — M357 Hypermobility syndrome: Secondary | ICD-10-CM | POA: Diagnosis not present

## 2019-08-31 DIAGNOSIS — R05 Cough: Secondary | ICD-10-CM | POA: Diagnosis not present

## 2019-08-31 DIAGNOSIS — R1310 Dysphagia, unspecified: Secondary | ICD-10-CM | POA: Diagnosis not present

## 2019-08-31 DIAGNOSIS — R208 Other disturbances of skin sensation: Secondary | ICD-10-CM | POA: Diagnosis not present

## 2019-08-31 DIAGNOSIS — E538 Deficiency of other specified B group vitamins: Secondary | ICD-10-CM | POA: Diagnosis not present

## 2019-08-31 DIAGNOSIS — R109 Unspecified abdominal pain: Secondary | ICD-10-CM | POA: Diagnosis not present

## 2019-08-31 LAB — NOVEL CORONAVIRUS, NAA: SARS-CoV-2, NAA: NOT DETECTED

## 2019-09-12 ENCOUNTER — Encounter: Payer: Self-pay | Admitting: Internal Medicine

## 2019-09-12 ENCOUNTER — Telehealth (INDEPENDENT_AMBULATORY_CARE_PROVIDER_SITE_OTHER): Payer: BC Managed Care – PPO | Admitting: Internal Medicine

## 2019-09-12 DIAGNOSIS — M2550 Pain in unspecified joint: Secondary | ICD-10-CM | POA: Diagnosis not present

## 2019-09-12 DIAGNOSIS — R519 Headache, unspecified: Secondary | ICD-10-CM | POA: Diagnosis not present

## 2019-09-12 MED ORDER — DICLOFENAC SODIUM 75 MG PO TBEC: 75.0000 mg | DELAYED_RELEASE_TABLET | Freq: Two times a day (BID) | ORAL | 1 refills | Status: DC

## 2019-09-12 NOTE — Telephone Encounter (Addendum)
Isabella Singh [phone]  [facility]  diclofenac (VOLTAREN) 75 MG EC tablet    [name] called to see if you would fill prescription for above medication, she was getting it from a general surgeon in [place], but has not been able to get in touch with them for the last 2 weeks. So she has been out and she states that now she is having more joint pain and has had headache for last 4 days. She feels if she can get this prescription refilled that would take care of these issues.  Personally reviewed chart. She has gotten relief with this med before and I told her at last visit it was OK to take. Has personally e-scrobed this for her today.

## 2019-09-17 ENCOUNTER — Other Ambulatory Visit: Payer: Self-pay

## 2019-09-17 MED ORDER — AMPHETAMINE-DEXTROAMPHET ER 15 MG PO CP24: 15.0000 mg | ORAL_CAPSULE | ORAL | 0 refills | Status: DC

## 2019-09-20 DIAGNOSIS — M26602 Left temporomandibular joint disorder, unspecified: Secondary | ICD-10-CM | POA: Diagnosis not present

## 2019-09-20 DIAGNOSIS — M9901 Segmental and somatic dysfunction of cervical region: Secondary | ICD-10-CM | POA: Diagnosis not present

## 2019-09-20 DIAGNOSIS — R194 Change in bowel habit: Secondary | ICD-10-CM | POA: Diagnosis not present

## 2019-09-20 DIAGNOSIS — K625 Hemorrhage of anus and rectum: Secondary | ICD-10-CM | POA: Diagnosis not present

## 2019-09-20 DIAGNOSIS — M5412 Radiculopathy, cervical region: Secondary | ICD-10-CM | POA: Diagnosis not present

## 2019-09-20 DIAGNOSIS — R634 Abnormal weight loss: Secondary | ICD-10-CM | POA: Diagnosis not present

## 2019-09-20 DIAGNOSIS — M9902 Segmental and somatic dysfunction of thoracic region: Secondary | ICD-10-CM | POA: Diagnosis not present

## 2019-09-20 DIAGNOSIS — R1033 Periumbilical pain: Secondary | ICD-10-CM | POA: Diagnosis not present

## 2019-09-24 DIAGNOSIS — M9902 Segmental and somatic dysfunction of thoracic region: Secondary | ICD-10-CM | POA: Diagnosis not present

## 2019-09-24 DIAGNOSIS — M9901 Segmental and somatic dysfunction of cervical region: Secondary | ICD-10-CM | POA: Diagnosis not present

## 2019-09-24 DIAGNOSIS — M5412 Radiculopathy, cervical region: Secondary | ICD-10-CM | POA: Diagnosis not present

## 2019-09-24 DIAGNOSIS — M26602 Left temporomandibular joint disorder, unspecified: Secondary | ICD-10-CM | POA: Diagnosis not present

## 2019-09-27 DIAGNOSIS — M5412 Radiculopathy, cervical region: Secondary | ICD-10-CM | POA: Diagnosis not present

## 2019-09-27 DIAGNOSIS — M9901 Segmental and somatic dysfunction of cervical region: Secondary | ICD-10-CM | POA: Diagnosis not present

## 2019-09-27 DIAGNOSIS — M9902 Segmental and somatic dysfunction of thoracic region: Secondary | ICD-10-CM | POA: Diagnosis not present

## 2019-09-27 DIAGNOSIS — M26602 Left temporomandibular joint disorder, unspecified: Secondary | ICD-10-CM | POA: Diagnosis not present

## 2019-10-09 DIAGNOSIS — M9902 Segmental and somatic dysfunction of thoracic region: Secondary | ICD-10-CM | POA: Diagnosis not present

## 2019-10-09 DIAGNOSIS — M26602 Left temporomandibular joint disorder, unspecified: Secondary | ICD-10-CM | POA: Diagnosis not present

## 2019-10-09 DIAGNOSIS — M9901 Segmental and somatic dysfunction of cervical region: Secondary | ICD-10-CM | POA: Diagnosis not present

## 2019-10-09 DIAGNOSIS — M5412 Radiculopathy, cervical region: Secondary | ICD-10-CM | POA: Diagnosis not present

## 2019-10-19 ENCOUNTER — Other Ambulatory Visit: Payer: Self-pay | Admitting: Internal Medicine

## 2019-10-19 MED ORDER — AMPHETAMINE-DEXTROAMPHET ER 15 MG PO CP24: 15.0000 mg | ORAL_CAPSULE | ORAL | 0 refills | Status: DC

## 2019-10-19 NOTE — Telephone Encounter (Signed)
Last refill 09/17/19

## 2019-10-19 NOTE — Telephone Encounter (Signed)
Received Fax RX request from  [facility]  Medication - amphetamine-dextroamphetamine (ADDERALL XR) 15 MG 24 hr capsule    Last Refill - 09/17/19  Last OV - 07/16/19  Last CPE 07/16/19

## 2019-10-23 ENCOUNTER — Other Ambulatory Visit: Payer: Self-pay

## 2019-10-23 ENCOUNTER — Encounter: Payer: Self-pay | Admitting: Endocrinology

## 2019-10-23 NOTE — Telephone Encounter (Signed)
Appointment scheduled for Thu 10/25/2019 at 8:00 AM

## 2019-10-23 NOTE — Telephone Encounter (Signed)
Please call pt to schedule appt. It appears last appt was virtual and pt did not call to schedule f/u in 6 months. Given her concern and the possible need for repeat labs, this will need to be an in person appt.

## 2019-10-25 ENCOUNTER — Ambulatory Visit: Payer: BC Managed Care – PPO | Admitting: Endocrinology

## 2019-10-25 ENCOUNTER — Encounter: Payer: Self-pay | Admitting: Endocrinology

## 2019-10-25 ENCOUNTER — Other Ambulatory Visit: Payer: Self-pay

## 2019-10-25 VITALS — BP 110/70 | HR 105 | Ht 66.0 in | Wt 147.4 lb

## 2019-10-25 DIAGNOSIS — E069 Thyroiditis, unspecified: Secondary | ICD-10-CM

## 2019-10-25 NOTE — Patient Instructions (Addendum)
At least once a year, you should have your neck examined, and blood tests of the thyroid and calcium. I would be happy to do this, or you could do with Dr Renold Genta, and I would be happy to see you back here as needed.

## 2019-10-25 NOTE — Progress Notes (Signed)
Subjective:    Patient ID: Isabella Singh, female    DOB: 07-Feb-1981, 38 y.o.   MRN: [id number]  HPI Pt returns for f/u of thyroiditis (found on a neck CT in 2019; she has never been on prescribed thyroid hormone therapy; [place] in 2019 showed small MNG--no [place] f/u needed) Pt also has hypocalcemia (dx'ed 2019; no cause was found recheck was normal). She still has slight swelling at the ant neck, but no assoc pain.   She also has low B-12.  She does not take vit-D supplement.   Past Medical History:  Diagnosis Date  . Elevated antinuclear antibody (ANA) level   . Endometriosis    sx 2008 (x1), 2018 (x2)  . Randell Patient infection    hx    Past Surgical History:  Procedure Laterality Date  . APPENDECTOMY  10/2017  . EXCISION OF ENDOMETRIOMA  10/2017  . NECK SURGERY  07/2017   c5 c6  . PARTIAL HYSTERECTOMY  05/2017    Social History   Socioeconomic History  . Marital status: Married    Spouse name: Not on file  . Number of children: Not on file  . Years of education: Not on file  . Highest education level: Not on file  Occupational History  . Occupation: Electrical engineer  Social Needs  . Financial resource strain: Not on file  . Food insecurity    Worry: Not on file    Inability: Not on file  . Transportation needs    Medical: Not on file    Non-medical: Not on file  Tobacco Use  . Smoking status: Never Smoker  . Smokeless tobacco: Never Used  Substance and Sexual Activity  . Alcohol use: Never    Frequency: Never  . Drug use: Never  . Sexual activity: Not on file  Lifestyle  . Physical activity    Days per week: Not on file    Minutes per session: Not on file  . Stress: Not on file  Relationships  . Social Herbalist on phone: Not on file    Gets together: Not on file    Attends religious service: Not on file    Active member of club or organization: Not on file    Attends meetings of clubs or organizations: Not on file    Relationship status: Not  on file  . Intimate partner violence    Fear of current or ex partner: Not on file    Emotionally abused: Not on file    Physically abused: Not on file    Forced sexual activity: Not on file  Other Topics Concern  . Not on file  Social History Narrative  . Not on file    Current Outpatient Medications on File Prior to Visit  Medication Sig Dispense Refill  . albuterol (VENTOLIN HFA) 108 (90 Base) MCG/ACT inhaler Inhale 1-2 puffs into the lungs every 6 (six) hours as needed for wheezing or shortness of breath. 18 g 0  . amphetamine-dextroamphetamine (ADDERALL XR) 15 MG 24 hr capsule Take 1 capsule by mouth every morning. 30 capsule 0  . diclofenac (VOLTAREN) 75 MG EC tablet Take 1 tablet (75 mg total) by mouth 2 (two) times daily. 60 tablet 1  . tiZANidine (ZANAFLEX) 2 MG tablet Take 2 mg by mouth at bedtime.    Marland Kitchen UNABLE TO FIND Med Name: Daily Wellbeing for Women - 2 daily     No current facility-administered medications on file prior to visit.  Allergies  Allergen Reactions  . Cyclobenzaprine Rash  . Sulfa Antibiotics     Family History  Problem Relation Age of Onset  . Allergies Mother   . Arthritis Mother   . Asthma Mother   . Headache Mother   . Headache Father   . Headache Sister   . Allergies Sister   . Arthritis Sister   . Allergies Brother   . Headache Brother   . Stroke Maternal Grandmother   . Stroke Paternal Grandmother   . Tuberculosis Other   . Thyroid disease Neg Hx     BP 110/70 (BP Location: Left Arm, Patient Position: Sitting, Cuff Size: Normal)   Pulse (!) 105   Ht 5\' 6"  (1.676 m)   Wt 147 lb 6.4 oz (66.9 kg)   SpO2 98%   BMI 23.79 kg/m   Review of Systems She has lost a few lbs.      Objective:   Physical Exam VITAL SIGNS:  See vs page GENERAL: no distress NECK: right longitudinal thyroid nodule is again noted (2x1 cm).        Assessment & Plan:  Low PTH, uncertain etiology.  Thyroiditis.  She is at risk for abnormal thyroid  function.   Patient Instructions  At least once a year, you should have your neck examined, and blood tests of the thyroid and calcium. I would be happy to do this, or you could do with Dr Renold Genta, and I would be happy to see you back here as needed.

## 2019-10-29 ENCOUNTER — Ambulatory Visit: Payer: BC Managed Care – PPO | Admitting: Internal Medicine

## 2019-11-05 ENCOUNTER — Other Ambulatory Visit: Payer: Self-pay

## 2019-11-05 MED ORDER — DICLOFENAC SODIUM 75 MG PO TBEC: 75.0000 mg | DELAYED_RELEASE_TABLET | Freq: Two times a day (BID) | ORAL | 5 refills | Status: DC

## 2019-11-07 DIAGNOSIS — K625 Hemorrhage of anus and rectum: Secondary | ICD-10-CM | POA: Diagnosis not present

## 2019-11-07 DIAGNOSIS — Z1211 Encounter for screening for malignant neoplasm of colon: Secondary | ICD-10-CM | POA: Diagnosis not present

## 2019-11-07 DIAGNOSIS — R194 Change in bowel habit: Secondary | ICD-10-CM | POA: Diagnosis not present

## 2019-11-08 ENCOUNTER — Ambulatory Visit: Payer: BC Managed Care – PPO | Admitting: Internal Medicine

## 2019-11-08 ENCOUNTER — Encounter: Payer: Self-pay | Admitting: Internal Medicine

## 2019-11-08 ENCOUNTER — Other Ambulatory Visit: Payer: Self-pay

## 2019-11-08 VITALS — BP 110/60 | HR 93 | Temp 98.0°F | Ht 66.0 in | Wt 146.0 lb

## 2019-11-08 DIAGNOSIS — R634 Abnormal weight loss: Secondary | ICD-10-CM

## 2019-11-08 DIAGNOSIS — Z8349 Family history of other endocrine, nutritional and metabolic diseases: Secondary | ICD-10-CM

## 2019-11-08 DIAGNOSIS — R5383 Other fatigue: Secondary | ICD-10-CM

## 2019-11-08 DIAGNOSIS — M26629 Arthralgia of temporomandibular joint, unspecified side: Secondary | ICD-10-CM

## 2019-11-08 DIAGNOSIS — F900 Attention-deficit hyperactivity disorder, predominantly inattentive type: Secondary | ICD-10-CM | POA: Diagnosis not present

## 2019-11-08 DIAGNOSIS — F411 Generalized anxiety disorder: Secondary | ICD-10-CM

## 2019-11-08 DIAGNOSIS — R1013 Epigastric pain: Secondary | ICD-10-CM

## 2019-11-08 MED ORDER — HYOSCYAMINE SULFATE 0.125 MG PO TABS: ORAL_TABLET | ORAL | 0 refills | Status: DC

## 2019-11-08 NOTE — Progress Notes (Signed)
   Subjective:    Patient ID: Isabella Singh, female    DOB: Feb 16, 1981, 38 y.o.   MRN: [id number]  HPI 38 year old Female for follow up of several issues. Takes Voltaren for joint pain. If misses dose of Voltaren, has significant joint pain. Adderall helps with excessive daytime sleepiness.  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  Saw Dr. Collene Mares recently regarding abdominal pain. Colonoscopy done yesterday was OK  without polyps with 10 year follow up recommended. Is to have abdominal ultrasound.  Has lost 13 pounds since August but is anxious and has a lot going on.  Saw Dr. Annabelle Harman at [facility] in September.Suggested B12 level be checked. Level was apparently low at 307 and she has been taking oral B12- Level has improved to 424 with oral supplement. Needs to be followed q 6 months.  Dr. Loanne Drilling saw her in November for f/u thyroiditis. Hx low PTH- with etiology unclear.    Review of Systems see above- has considerable situational stress with home schooling children and taking on a 101 year old. Hx MVP apparently. I think we should defer cardiology evaluation for now- have previously explained this is a benign condition.    Objective:   Physical Exam BP 100/60 pulse 93, T 98 degrees Pulse ox 99% Weight 146 pounds. BMI 23.57.Has lost 10 pounds since August. Attention deficit issues stable with Adderall XL 15 mg daily. Able to focus and get right much done at home. Has more energy.      Assessment & Plan:  Hx positive ANA 1:320- lupus has been ruled out but this is likely reason for joint pain  Anxiety about her health and other issues.  Attention deficit- Adderall currently working well for her.  Hx thyroiditis follow ed by Endocrine and stable  Low normal B12 level responded to oral supplement- needs to be followed  Hx hypocalcemia- followed by Endocrinologist  Abdominal pain being evaluated by Dr. Collene Mares and she is to have  ultrasound in the near future  Weight loss of 10 pounds over the past few months continue to monitor and call if persistent or worse.  I think some of this is stress.  Free T4 and TSH were normal in September.  Plan: Continue Voltaren for musculoskeletal pain.  Continue Adderall XR for attention deficit disorder.  Continue B12 oral supplementation.  Will need follow-up Spring 2021.  Health maintenance exam due August 2021.  Patient is to monitor her weight and try to eat regularly.  25 minutes spent with patient one of the multiple issues.

## 2019-11-09 LAB — PREALBUMIN: Prealbumin: 25 mg/dL (ref 17–34)

## 2019-11-09 LAB — VITAMIN B12: Vitamin B-12: 424 pg/mL (ref 200–1100)

## 2019-11-13 DIAGNOSIS — M9901 Segmental and somatic dysfunction of cervical region: Secondary | ICD-10-CM | POA: Diagnosis not present

## 2019-11-13 DIAGNOSIS — M9902 Segmental and somatic dysfunction of thoracic region: Secondary | ICD-10-CM | POA: Diagnosis not present

## 2019-11-13 DIAGNOSIS — M5412 Radiculopathy, cervical region: Secondary | ICD-10-CM | POA: Diagnosis not present

## 2019-11-13 DIAGNOSIS — M26602 Left temporomandibular joint disorder, unspecified: Secondary | ICD-10-CM | POA: Diagnosis not present

## 2019-11-20 DIAGNOSIS — M9902 Segmental and somatic dysfunction of thoracic region: Secondary | ICD-10-CM | POA: Diagnosis not present

## 2019-11-20 DIAGNOSIS — M9901 Segmental and somatic dysfunction of cervical region: Secondary | ICD-10-CM | POA: Diagnosis not present

## 2019-11-20 DIAGNOSIS — M5412 Radiculopathy, cervical region: Secondary | ICD-10-CM | POA: Diagnosis not present

## 2019-11-20 DIAGNOSIS — M26602 Left temporomandibular joint disorder, unspecified: Secondary | ICD-10-CM | POA: Diagnosis not present

## 2019-11-22 ENCOUNTER — Other Ambulatory Visit: Payer: Self-pay

## 2019-11-22 DIAGNOSIS — Z20828 Contact with and (suspected) exposure to other viral communicable diseases: Secondary | ICD-10-CM | POA: Diagnosis not present

## 2019-11-22 DIAGNOSIS — Z03818 Encounter for observation for suspected exposure to other biological agents ruled out: Secondary | ICD-10-CM | POA: Diagnosis not present

## 2019-11-22 MED ORDER — AMPHETAMINE-DEXTROAMPHET ER 15 MG PO CP24: 15.0000 mg | ORAL_CAPSULE | ORAL | 0 refills | Status: DC

## 2019-12-01 NOTE — Patient Instructions (Signed)
Have ultrasound for Dr. Collene Mares.  Continue current medications.  Monitor your weight and try to eat regularly.  Take oral B12.  Follow-up Spring 2021.  Your physical exam is due August 2021.

## 2019-12-13 ENCOUNTER — Encounter: Payer: Self-pay | Admitting: Endocrinology

## 2019-12-19 ENCOUNTER — Other Ambulatory Visit: Payer: Self-pay | Admitting: Internal Medicine

## 2019-12-19 NOTE — Telephone Encounter (Signed)
Received Fax RX request from  [place], [place] - [address] Phone:  [phone]  Fax:  [phone]       Medication - amphetamine-dextroamphetamine (ADDERALL XR) 15 MG 24 hr capsule   Last Refill - 11/22/19  Last OV - 11/08/19  Last CPE - 07/16/19  Next Appointment -

## 2019-12-24 MED ORDER — AMPHETAMINE-DEXTROAMPHET ER 15 MG PO CP24: 15.0000 mg | ORAL_CAPSULE | ORAL | 0 refills | Status: DC

## 2019-12-24 NOTE — Addendum Note (Signed)
Addended by: Mady Haagensen on: 12/24/2019 03:21 PM   Modules accepted: Orders

## 2019-12-24 NOTE — Telephone Encounter (Signed)
Last refill 11/22/19.

## 2019-12-26 ENCOUNTER — Other Ambulatory Visit: Payer: Self-pay | Admitting: Obstetrics and Gynecology

## 2019-12-26 DIAGNOSIS — E042 Nontoxic multinodular goiter: Secondary | ICD-10-CM | POA: Diagnosis not present

## 2019-12-26 DIAGNOSIS — R3982 Chronic bladder pain: Secondary | ICD-10-CM | POA: Diagnosis not present

## 2019-12-26 DIAGNOSIS — N803 Endometriosis of pelvic peritoneum: Secondary | ICD-10-CM | POA: Diagnosis not present

## 2019-12-26 DIAGNOSIS — N809 Endometriosis, unspecified: Secondary | ICD-10-CM

## 2019-12-26 DIAGNOSIS — N643 Galactorrhea not associated with childbirth: Secondary | ICD-10-CM | POA: Diagnosis not present

## 2019-12-28 ENCOUNTER — Telehealth: Payer: Self-pay

## 2019-12-28 NOTE — Telephone Encounter (Signed)
Spoke with [name] from [facility] about a PA for ADDERALL on Monday, [facility] phone number so I called [name] and told her wrong number was faxed she said she was going to call patient's insurance number and try to get it approved. Patient called today has not heard from pharmacy. I got on the phone with the pharmacy they gave a different number to call to get rx approved. I filled out a form to patient's insurance it was reviewed by Dr. Renold Genta and signed and faxed.

## 2019-12-31 ENCOUNTER — Encounter: Payer: Self-pay | Admitting: Endocrinology

## 2020-01-07 ENCOUNTER — Other Ambulatory Visit: Payer: Self-pay

## 2020-01-07 ENCOUNTER — Ambulatory Visit
Admission: RE | Admit: 2020-01-07 | Discharge: 2020-01-07 | Disposition: A | Discharge status: Home / Self Care | Payer: BC Managed Care – PPO | Source: Ambulatory Visit | Attending: Obstetrics and Gynecology | Admitting: Obstetrics and Gynecology

## 2020-01-07 ENCOUNTER — Ambulatory Visit (INDEPENDENT_AMBULATORY_CARE_PROVIDER_SITE_OTHER): Payer: BC Managed Care – PPO | Admitting: Endocrinology

## 2020-01-07 ENCOUNTER — Encounter: Payer: Self-pay | Admitting: Endocrinology

## 2020-01-07 DIAGNOSIS — E042 Nontoxic multinodular goiter: Secondary | ICD-10-CM

## 2020-01-07 DIAGNOSIS — N809 Endometriosis, unspecified: Secondary | ICD-10-CM

## 2020-01-07 DIAGNOSIS — R102 Pelvic and perineal pain: Secondary | ICD-10-CM | POA: Diagnosis not present

## 2020-01-07 NOTE — Progress Notes (Signed)
Subjective:    Patient ID: Isabella Singh, female    DOB: 03/02/1981, 39 y.o.   MRN: [id number]  HPI telehealth visit today via doxy video visit.  Alternatives to telehealth are presented to this patient, and the patient agrees to the telehealth visit. Pt is advised of the cost of the visit, and agrees to this, also.   Patient is in her car, and I am at the office.   Persons attending the telehealth visit: the patient and I. Pt returns for f/u of thyroiditis (found on a neck CT in 2019; she has never been on prescribed thyroid hormone therapy; [place] in 2019 showed small MNG--no [place] f/u needed; she had C-spine procedure, with ant approach in 2018).  Pt says f/u US should be done, due to Ca++ seen on CT 200, and 2019 [place].  She has slight fullness at the ant neck. She has lost weight.   Pt also has hypocalcemia (dx'ed 2019; no cause was found recheck was normal).   Past Medical History:  Diagnosis Date  . Elevated antinuclear antibody (ANA) level   . Endometriosis    sx 2008 (x1), 2018 (x2)  . Randell Patient infection    hx    Past Surgical History:  Procedure Laterality Date  . APPENDECTOMY  10/2017  . EXCISION OF ENDOMETRIOMA  10/2017  . NECK SURGERY  07/2017   c5 c6  . PARTIAL HYSTERECTOMY  05/2017    Social History   Socioeconomic History  . Marital status: Married    Spouse name: Not on file  . Number of children: Not on file  . Years of education: Not on file  . Highest education level: Not on file  Occupational History  . Occupation: Electrical engineer  Tobacco Use  . Smoking status: Never Smoker  . Smokeless tobacco: Never Used  Substance and Sexual Activity  . Alcohol use: Never  . Drug use: Never  . Sexual activity: Not on file  Other Topics Concern  . Not on file  Social History Narrative  . Not on file   Social Determinants of Health   Financial Resource Strain:   . Difficulty of Paying Living Expenses: Not on file  Food Insecurity:   . Worried About  Charity fundraiser in the Last Year: Not on file  . Ran Out of Food in the Last Year: Not on file  Transportation Needs:   . Lack of Transportation (Medical): Not on file  . Lack of Transportation (Non-Medical): Not on file  Physical Activity:   . Days of Exercise per Week: Not on file  . Minutes of Exercise per Session: Not on file  Stress:   . Feeling of Stress : Not on file  Social Connections:   . Frequency of Communication with Friends and Family: Not on file  . Frequency of Social Gatherings with Friends and Family: Not on file  . Attends Religious Services: Not on file  . Active Member of Clubs or Organizations: Not on file  . Attends Archivist Meetings: Not on file  . Marital Status: Not on file  Intimate Partner Violence:   . Fear of Current or Ex-Partner: Not on file  . Emotionally Abused: Not on file  . Physically Abused: Not on file  . Sexually Abused: Not on file    Current Outpatient Medications on File Prior to Visit  Medication Sig Dispense Refill  . amphetamine-dextroamphetamine (ADDERALL XR) 15 MG 24 hr capsule Take 1 capsule by mouth  every morning. 30 capsule 0  . diclofenac (VOLTAREN) 75 MG EC tablet Take 1 tablet (75 mg total) by mouth 2 (two) times daily. 60 tablet 5  . hyoscyamine (LEVSIN) 0.125 MG tablet One po as needed for irritable bowel syndrome up to twice daily 30 tablet 0   No current facility-administered medications on file prior to visit.    Allergies  Allergen Reactions  . Cyclobenzaprine Rash  . Sulfa Antibiotics     Family History  Problem Relation Age of Onset  . Allergies Mother   . Arthritis Mother   . Asthma Mother   . Headache Mother   . Headache Father   . Headache Sister   . Allergies Sister   . Arthritis Sister   . Allergies Brother   . Headache Brother   . Stroke Maternal Grandmother   . Stroke Paternal Grandmother   . Tuberculosis Other   . Thyroid disease Neg Hx     There were no vitals taken for this  visit.   Review of Systems She has slight neck pain.      Objective:   Physical Exam   outside test results are reviewed:  TSH=1.0    Assessment & Plan:  MNG: in view of Ca++, we'll recheck in mid-2021.  Weight loss: I told pt this is not thyroid-related.   Patient Instructions  Let's recheck the ultrasound.  you will receive a phone call, about a day and time for an appointment. I would be happy to see you back here as needed.  You should have your thyroid blood test at least once per year

## 2020-01-07 NOTE — Patient Instructions (Addendum)
Let's recheck the ultrasound.  you will receive a phone call, about a day and time for an appointment. I would be happy to see you back here as needed.  You should have your thyroid blood test at least once per year

## 2020-01-11 ENCOUNTER — Ambulatory Visit
Admission: RE | Admit: 2020-01-11 | Discharge: 2020-01-11 | Disposition: A | Discharge status: Home / Self Care | Payer: BC Managed Care – PPO | Source: Ambulatory Visit | Attending: Endocrinology | Admitting: Endocrinology

## 2020-01-11 DIAGNOSIS — E042 Nontoxic multinodular goiter: Secondary | ICD-10-CM

## 2020-01-11 DIAGNOSIS — E041 Nontoxic single thyroid nodule: Secondary | ICD-10-CM | POA: Diagnosis not present

## 2020-01-23 DIAGNOSIS — E538 Deficiency of other specified B group vitamins: Secondary | ICD-10-CM | POA: Diagnosis not present

## 2020-01-29 DIAGNOSIS — E559 Vitamin D deficiency, unspecified: Secondary | ICD-10-CM | POA: Diagnosis not present

## 2020-01-29 DIAGNOSIS — E063 Autoimmune thyroiditis: Secondary | ICD-10-CM | POA: Diagnosis not present

## 2020-01-29 DIAGNOSIS — E039 Hypothyroidism, unspecified: Secondary | ICD-10-CM | POA: Diagnosis not present

## 2020-01-29 DIAGNOSIS — E782 Mixed hyperlipidemia: Secondary | ICD-10-CM | POA: Diagnosis not present

## 2020-01-29 DIAGNOSIS — B3782 Candidal enteritis: Secondary | ICD-10-CM | POA: Diagnosis not present

## 2020-01-29 DIAGNOSIS — R5383 Other fatigue: Secondary | ICD-10-CM | POA: Diagnosis not present

## 2020-01-29 DIAGNOSIS — E7212 Methylenetetrahydrofolate reductase deficiency: Secondary | ICD-10-CM | POA: Diagnosis not present

## 2020-01-30 DIAGNOSIS — Z03818 Encounter for observation for suspected exposure to other biological agents ruled out: Secondary | ICD-10-CM | POA: Diagnosis not present

## 2020-02-04 ENCOUNTER — Telehealth: Payer: Self-pay | Admitting: Internal Medicine

## 2020-02-04 ENCOUNTER — Other Ambulatory Visit: Payer: Self-pay | Admitting: Internal Medicine

## 2020-02-04 DIAGNOSIS — F900 Attention-deficit hyperactivity disorder, predominantly inattentive type: Secondary | ICD-10-CM

## 2020-02-04 DIAGNOSIS — F988 Other specified behavioral and emotional disorders with onset usually occurring in childhood and adolescence: Secondary | ICD-10-CM

## 2020-02-04 NOTE — Telephone Encounter (Signed)
Isabella Singh [phone]  Earlyne called to see if we had gotten a request from pharmacy to refill below medication, she stated she called them last week. She took last pill yesterday. I did let her know with this medication that she should call us the day before she is to run out to get refills each month.   amphetamine-dextroamphetamine (ADDERALL XR) 15 MG 24 hr capsule  [facility] [place], [place] - [address] Phone:  [phone]  Fax:  [phone]

## 2020-02-04 NOTE — Telephone Encounter (Signed)
Last refill 10/13/20

## 2020-02-04 NOTE — Telephone Encounter (Signed)
Faxed Rx this evening at 6:01 pm as could not get electronic Rx to go through

## 2020-02-04 NOTE — Addendum Note (Signed)
Addended by: Mady Haagensen on: 02/04/2020 04:12 PM   Modules accepted: Orders

## 2020-02-05 ENCOUNTER — Telehealth: Payer: Self-pay | Admitting: Internal Medicine

## 2020-02-05 ENCOUNTER — Encounter: Payer: Self-pay | Admitting: Internal Medicine

## 2020-02-05 NOTE — Telephone Encounter (Signed)
Written prescription for Adderall for patient to pick up and take to pharmacy since we could not get [facility] to go through to [facility] yesterday despite multiple attempts.

## 2020-02-05 NOTE — Telephone Encounter (Signed)
Patient to pick up written Rx tomorrow

## 2020-02-07 DIAGNOSIS — N3010 Interstitial cystitis (chronic) without hematuria: Secondary | ICD-10-CM | POA: Insufficient documentation

## 2020-02-07 DIAGNOSIS — R3982 Chronic bladder pain: Secondary | ICD-10-CM | POA: Insufficient documentation

## 2020-02-11 DIAGNOSIS — R350 Frequency of micturition: Secondary | ICD-10-CM | POA: Diagnosis not present

## 2020-02-11 DIAGNOSIS — R339 Retention of urine, unspecified: Secondary | ICD-10-CM | POA: Diagnosis not present

## 2020-02-11 DIAGNOSIS — N3010 Interstitial cystitis (chronic) without hematuria: Secondary | ICD-10-CM | POA: Diagnosis not present

## 2020-03-04 MED ORDER — AMPHETAMINE-DEXTROAMPHET ER 15 MG PO CP24: 15.0000 mg | ORAL_CAPSULE | ORAL | 0 refills | Status: DC

## 2020-03-04 NOTE — Telephone Encounter (Signed)
Patient requesting Adderall refill. We have e-scribed for 30 days. Please advise payient we will continue this for 90 days from today and that she needs to establish with [facility] for further evaluation and treatment of attention deficit disorder.

## 2020-03-05 ENCOUNTER — Telehealth: Payer: Self-pay | Admitting: Internal Medicine

## 2020-03-05 NOTE — Telephone Encounter (Signed)
LVM for patient to call office.  When she calls back we need to let her know that Dr Renold Genta will refill her Aderall for 90 days and at that time she needs to be set up to see [facility] to manage this medication.  Dr Rachel Moulds [facility] [phone] [address], [place], [place] [address]  Phone # [phone]

## 2020-03-20 NOTE — Telephone Encounter (Signed)
Spoke with Isabella Singh and let her know that Dr Renold Genta would only be filling this for 90 Days and that she needs to call [facility] for appointment to take over managing this medication. She will call back if she needs number

## 2020-04-04 ENCOUNTER — Other Ambulatory Visit: Payer: Self-pay

## 2020-04-04 MED ORDER — AMPHETAMINE-DEXTROAMPHET ER 15 MG PO CP24: 15.0000 mg | ORAL_CAPSULE | ORAL | 0 refills | Status: DC

## 2020-04-04 NOTE — Telephone Encounter (Signed)
Patient called requesting refill for medication below, she was told by Dr Renold Genta that she needs to get in touch with [facility] to take over this medication and treatment, and she stated she had called them but they had not called her back.  This is 2 nd refill since patient has been told that Dr Renold Genta will no longer be refilling this medication after 90 days.. Last refill will be 05/04/2020.  amphetamine-dextroamphetamine (ADDERALL XR) 15 MG 24 hr capsule

## 2020-06-25 ENCOUNTER — Ambulatory Visit: Payer: BC Managed Care – PPO | Admitting: Family Medicine

## 2020-09-26 ENCOUNTER — Encounter: Payer: Self-pay | Admitting: Family Medicine

## 2020-09-26 ENCOUNTER — Other Ambulatory Visit: Payer: Self-pay

## 2020-09-26 ENCOUNTER — Ambulatory Visit (INDEPENDENT_AMBULATORY_CARE_PROVIDER_SITE_OTHER): Payer: Managed Care, Other (non HMO) | Admitting: Family Medicine

## 2020-09-26 VITALS — BP 104/60 | HR 78 | Ht 66.0 in | Wt 139.0 lb

## 2020-09-26 DIAGNOSIS — F988 Other specified behavioral and emotional disorders with onset usually occurring in childhood and adolescence: Secondary | ICD-10-CM | POA: Diagnosis not present

## 2020-09-26 DIAGNOSIS — M357 Hypermobility syndrome: Secondary | ICD-10-CM | POA: Insufficient documentation

## 2020-09-26 MED ORDER — AMPHETAMINE-DEXTROAMPHET ER 15 MG PO CP24: 15.0000 mg | ORAL_CAPSULE | ORAL | 0 refills | Status: DC

## 2020-09-26 MED ORDER — AMPHETAMINE-DEXTROAMPHET ER 20 MG PO CP24: 20.0000 mg | ORAL_CAPSULE | ORAL | 0 refills | Status: DC

## 2020-09-26 NOTE — Progress Notes (Deleted)
Patient: Isabella Singh MRN: [id number] DOB: 1981/10/16 PCP: Elby Showers, MD     Subjective:  No chief complaint on file.   HPI: The patient is a 39 y.o. female who presents today for annual exam. {He/she (caps):30048} denies any changes to past medical history. There have been no recent hospitalizations. They {Actions; are/are not:16769} following a well balanced diet and exercise plan. Weight has been {trend:16658}. No complaints today.    There is no immunization history on file for this patient. Colonoscopy: Mammogram:  Pap smear:  PSA:   Review of Systems  Allergies Patient is allergic to cyclobenzaprine and sulfa antibiotics.  Past Medical History Patient  has a past medical history of Elevated antinuclear antibody (ANA) level, Endometriosis, and Epstein Barr infection.  Surgical History Patient  has a past surgical history that includes Partial hysterectomy (05/2017); Excision of endometrioma (10/2017); Appendectomy (10/2017); and Neck surgery (07/2017).  Family History Pateint's family history includes Allergies in her brother, mother, and sister; Arthritis in her mother and sister; Asthma in her mother; Headache in her brother, father, mother, and sister; Stroke in her maternal grandmother and paternal grandmother; Tuberculosis in an other family member.  Social History Patient  reports that she has never smoked. She has never used smokeless tobacco. She reports current alcohol use. She reports that she does not use drugs.    Objective: Vitals:   09/26/20 0942  Weight: 139 lb (63 kg)  Height: 5\' 6"  (1.676 m)    Body mass index is 22.44 kg/m.  Physical Exam     Assessment/plan:    This visit occurred during the SARS-CoV-2 public health emergency.  Safety protocols were in place, including screening questions prior to the visit, additional usage of staff PPE, and extensive cleaning of exam room while observing appropriate contact time as indicated for  disinfecting solutions.    No follow-ups on file.     Orma Flaming, MD [place]  09/26/2020

## 2020-09-26 NOTE — Patient Instructions (Addendum)
-  on controlled medication I see you every 3 months so you have to come in regularly.   -we will start you out at 20mg  XR/day. Keep me up to date and let me know if you like this or don't before you come back in 3 months time.    -

## 2020-09-26 NOTE — Progress Notes (Signed)
Patient: Isabella Singh MRN: [id number] DOB: April 03, 1981 PCP: Elby Showers, MD     Subjective:  Chief Complaint  Patient presents with  . ADHD    HPI: The patient is a 39 y.o. female who presents today for establishing care and refill her adderall. She has history of ADD that was diagnosed as a child. She also has hypersomnia with a sleep study in the past. She states it helps her more with the sleep issue. She has been on medication for a while and is currently on 10mg  XR and is wondering if she can increase to 20mg  XR. She has not side effects and denies any headaches, GI upset, weight loss or insomnia.   She has had numerous labs this year and doesn't want any labs or annual done right now.   Review of Systems  Constitutional: Negative for chills, fatigue and fever.  HENT: Negative for dental problem, ear pain, hearing loss and trouble swallowing.   Eyes: Negative for visual disturbance.  Respiratory: Negative for cough, chest tightness and shortness of breath.   Cardiovascular: Negative for chest pain, palpitations and leg swelling.  Gastrointestinal: Negative for abdominal pain, blood in stool, diarrhea and nausea.  Endocrine: Negative for cold intolerance, polydipsia, polyphagia and polyuria.  Genitourinary: Negative for dysuria and hematuria.  Musculoskeletal: Negative for arthralgias.  Skin: Negative for rash.  Neurological: Negative for dizziness and headaches.  Psychiatric/Behavioral: Negative for dysphoric mood and sleep disturbance. The patient is not nervous/anxious.     Allergies Patient is allergic to cyclobenzaprine, sulfa antibiotics, and gluten meal.  Past Medical History Patient  has a past medical history of Elevated antinuclear antibody (ANA) level, Endometriosis, and Epstein Barr infection.  Surgical History Patient  has a past surgical history that includes Partial hysterectomy (05/2017); Excision of endometrioma (10/2017); Appendectomy (10/2017); and  Neck surgery (07/2017).  Family History Pateint's family history includes Allergies in her brother, mother, and sister; Arthritis in her mother and sister; Asthma in her mother; Headache in her brother, father, mother, and sister; Stroke in her maternal grandmother and paternal grandmother; Tuberculosis in an other family member.  Social History Patient  reports that she has never smoked. She has never used smokeless tobacco. She reports current alcohol use. She reports that she does not use drugs.    Objective: Vitals:   09/26/20 0942  BP: 104/60  Pulse: 78  SpO2: 98%  Weight: 139 lb (63 kg)  Height: 5\' 6"  (1.676 m)    Body mass index is 22.44 kg/m.  Physical Exam Vitals reviewed.  Constitutional:      Appearance: She is well-developed.  HENT:     Right Ear: External ear normal.     Left Ear: External ear normal.  Eyes:     Conjunctiva/sclera: Conjunctivae normal.     Pupils: Pupils are equal, round, and reactive to light.  Neck:     Thyroid: No thyromegaly.  Cardiovascular:     Rate and Rhythm: Normal rate and regular rhythm.     Heart sounds: Normal heart sounds. No murmur heard.   Pulmonary:     Effort: Pulmonary effort is normal.     Breath sounds: Normal breath sounds.  Abdominal:     General: Bowel sounds are normal. There is no distension.     Palpations: Abdomen is soft.     Tenderness: There is no abdominal tenderness.  Musculoskeletal:     Cervical back: Normal range of motion and neck supple.  Lymphadenopathy:  Cervical: No cervical adenopathy.  Skin:    General: Skin is warm and dry.     Capillary Refill: Capillary refill takes less than 2 seconds.     Findings: No rash.  Neurological:     General: No focal deficit present.     Mental Status: She is alert and oriented to person, place, and time.     Cranial Nerves: No cranial nerve deficit.     Coordination: Coordination normal.     Deep Tendon Reflexes: Reflexes normal.  Psychiatric:         Mood and Affect: Mood normal.        Behavior: Behavior normal.        Assessment/plan: 1. Attention deficit disorder (ADD) without hyperactivity -starting her out on adderall XR 20mg /day. Discussed she has to be seen every 3 months for controlled drugs. Will do drug contract at next visit. Any issues she is to call or email me. F/u in 3 months. pmp website reviewed.   Chart/meds/problem list and HM reviewed.    This visit occurred during the SARS-CoV-2 public health emergency.  Safety protocols were in place, including screening questions prior to the visit, additional usage of staff PPE, and extensive cleaning of exam room while observing appropriate contact time as indicated for disinfecting solutions.    Return in about 3 months (around 12/27/2020) for add/medication check .    Orma Flaming, MD [place]  09/26/2020

## 2020-12-10 ENCOUNTER — Telehealth: Payer: Self-pay

## 2020-12-10 MED ORDER — AMPHETAMINE-DEXTROAMPHET ER 20 MG PO CP24: 20.0000 mg | ORAL_CAPSULE | ORAL | 0 refills | Status: DC

## 2020-12-10 NOTE — Telephone Encounter (Signed)
..   LAST APPOINTMENT DATE: 09/26/2020   NEXT APPOINTMENT DATE:@1 /26/2022  MEDICATION:amphetamine-dextroamphetamine (ADDERALL XR) 20 MG 24 hr capsule    PHARMACY:[facility] - [place], [place] - [address]

## 2020-12-31 ENCOUNTER — Ambulatory Visit: Payer: Managed Care, Other (non HMO) | Admitting: Family Medicine

## 2021-01-01 ENCOUNTER — Ambulatory Visit (INDEPENDENT_AMBULATORY_CARE_PROVIDER_SITE_OTHER): Payer: Managed Care, Other (non HMO) | Admitting: Family Medicine

## 2021-01-01 ENCOUNTER — Encounter: Payer: Self-pay | Admitting: Family Medicine

## 2021-01-01 ENCOUNTER — Other Ambulatory Visit: Payer: Self-pay

## 2021-01-01 VITALS — BP 86/60 | HR 77 | Temp 97.0°F | Ht 66.0 in | Wt 130.6 lb

## 2021-01-01 DIAGNOSIS — J4520 Mild intermittent asthma, uncomplicated: Secondary | ICD-10-CM

## 2021-01-01 DIAGNOSIS — F988 Other specified behavioral and emotional disorders with onset usually occurring in childhood and adolescence: Secondary | ICD-10-CM

## 2021-01-01 DIAGNOSIS — J45909 Unspecified asthma, uncomplicated: Secondary | ICD-10-CM | POA: Insufficient documentation

## 2021-01-01 MED ORDER — AMPHETAMINE-DEXTROAMPHET ER 20 MG PO CP24: 20.0000 mg | ORAL_CAPSULE | ORAL | 0 refills | Status: DC

## 2021-01-01 MED ORDER — ALBUTEROL SULFATE (2.5 MG/3ML) 0.083% IN NEBU: 2.5000 mg | INHALATION_SOLUTION | Freq: Four times a day (QID) | RESPIRATORY_TRACT | 1 refills | Status: DC | PRN

## 2021-01-01 MED ORDER — ALBUTEROL SULFATE HFA 108 (90 BASE) MCG/ACT IN AERS: 2.0000 | INHALATION_SPRAY | Freq: Four times a day (QID) | RESPIRATORY_TRACT | 1 refills | Status: DC | PRN

## 2021-01-01 MED ORDER — MONTELUKAST SODIUM 10 MG PO TABS: 10.0000 mg | ORAL_TABLET | Freq: Every day | ORAL | 1 refills | Status: DC

## 2021-01-01 NOTE — Progress Notes (Signed)
Patient: Isabella Singh MRN: [id number] DOB: 1981/06/19 PCP: Orma Flaming, MD     Subjective:  Chief Complaint  Patient presents with  . ADD  . Shortness of Breath    She says that she has been having to use inhalers that aren't prescribed to her.   . Cough  . asthma flair    HPI: The patient is a 40 y.o. female who presents today for ADD and ashtma flair.   ADD Currently on adderall XR 20mg /day. Last filled on 12/11/19. She does take daily. She fills correctly. It helps her stay alert and concentrated. Happy with dosing. Denies any side effects.   Asthma flair -she has been needed her rescue inhaler and has been using her mom's flovent as well. She has diagnosis of asthma. Has had PFTs done. Needing a refill of her albuterol. ? If used singulair in the past. discussed I like using this before steroids.   Needs annual labs done. Will do at her f/u in 3 months.   Review of Systems  Constitutional: Negative for chills, fatigue and fever.  HENT: Negative for dental problem, ear pain, hearing loss and trouble swallowing.   Eyes: Negative for visual disturbance.  Respiratory: Positive for cough and shortness of breath. Negative for chest tightness.   Cardiovascular: Negative for chest pain, palpitations and leg swelling.  Gastrointestinal: Negative for abdominal pain, blood in stool, diarrhea and nausea.  Endocrine: Negative for cold intolerance, polydipsia, polyphagia and polyuria.  Genitourinary: Negative for dysuria and hematuria.  Musculoskeletal: Negative for arthralgias.  Skin: Negative for rash.  Neurological: Negative for dizziness and headaches.  Psychiatric/Behavioral: Negative for dysphoric mood and sleep disturbance. The patient is not nervous/anxious.     Allergies Patient is allergic to cyclobenzaprine, sulfa antibiotics, and gluten meal.  Past Medical History Patient  has a past medical history of Elevated antinuclear antibody (ANA) level, Endometriosis, and  Epstein Barr infection.  Surgical History Patient  has a past surgical history that includes Partial hysterectomy (05/2017); Excision of endometrioma (10/2017); Appendectomy (10/2017); and Neck surgery (07/2017).  Family History Pateint's family history includes Allergies in her brother, mother, and sister; Arthritis in her mother and sister; Asthma in her mother; Headache in her brother, father, mother, and sister; Stroke in her maternal grandmother and paternal grandmother; Tuberculosis in an other family member.  Social History Patient  reports that she has never smoked. She has never used smokeless tobacco. She reports current alcohol use. She reports that she does not use drugs.    Objective: Vitals:   01/01/21 1040  BP: (!) 86/60  Pulse: 77  Temp: (!) 97 F (36.1 C)  TempSrc: Temporal  SpO2: 99%  Weight: 130 lb 9.6 oz (59.2 kg)  Height: 5\' 6"  (1.676 m)    Body mass index is 21.08 kg/m.  Physical Exam Vitals reviewed.  Constitutional:      Appearance: She is well-developed, normal weight and well-nourished.  HENT:     Head: Normocephalic and atraumatic.     Right Ear: External ear normal.     Left Ear: External ear normal.     Mouth/Throat:     Mouth: Oropharynx is clear and moist.  Eyes:     Extraocular Movements: EOM normal.     Conjunctiva/sclera: Conjunctivae normal.     Pupils: Pupils are equal, round, and reactive to light.  Neck:     Thyroid: No thyromegaly.  Cardiovascular:     Rate and Rhythm: Normal rate and regular rhythm.  Pulses: Intact distal pulses.     Heart sounds: Normal heart sounds. No murmur heard.   Pulmonary:     Effort: Pulmonary effort is normal.     Breath sounds: Normal breath sounds. No decreased breath sounds, wheezing, rhonchi or rales.  Abdominal:     General: Bowel sounds are normal. There is no distension.     Palpations: Abdomen is soft.     Tenderness: There is no abdominal tenderness.  Musculoskeletal:     Cervical  back: Normal range of motion and neck supple.  Lymphadenopathy:     Cervical: No cervical adenopathy.  Skin:    General: Skin is warm and dry.     Capillary Refill: Capillary refill takes less than 2 seconds.     Findings: No rash.  Neurological:     General: No focal deficit present.     Mental Status: She is alert and oriented to person, place, and time.     Cranial Nerves: No cranial nerve deficit.     Coordination: Coordination normal.     Deep Tendon Reflexes: Reflexes normal.  Psychiatric:        Mood and Affect: Mood and affect and mood normal.        Behavior: Behavior normal.        Assessment/plan: 1. Attention deficit disorder (ADD) without hyperactivity PMP website verified and she Is filling and taking correctly. Continue with current dosing. refills given today. Will need drug contract at her next visit and UDS.   2. Mild intermittent asthma without complication Refilled albuterol for her. discussed her lung exam is clear today. Will have her start singulair and see if we can keep her off inhaled steroids. Continue with nebs prn. Precautions given.       Return in about 3 months (around 04/01/2021) for annual/ADD .    Orma Flaming, MD [place]   01/01/2021

## 2021-01-01 NOTE — Patient Instructions (Signed)
We will do your annual at f/u in 3 months! So fast next time!   Dr. Rogers Blocker

## 2021-01-15 ENCOUNTER — Other Ambulatory Visit: Payer: Self-pay | Admitting: Gastroenterology

## 2021-01-15 ENCOUNTER — Other Ambulatory Visit (HOSPITAL_COMMUNITY): Payer: Self-pay | Admitting: Gastroenterology

## 2021-01-15 DIAGNOSIS — R1011 Right upper quadrant pain: Secondary | ICD-10-CM

## 2021-01-15 LAB — BASIC METABOLIC PANEL
BUN: 10 (ref 4–21)
CO2: 23 — AB (ref 13–22)
Chloride: 102 (ref 99–108)
Creatinine: 0.8 (ref 0.5–1.1)
Glucose: 92
Potassium: 4.6 (ref 3.4–5.3)
Sodium: 141 (ref 137–147)

## 2021-01-15 LAB — CBC AND DIFFERENTIAL
HCT: 41 (ref 36–46)
Hemoglobin: 13.6 (ref 12.0–16.0)
Platelets: 340 (ref 150–399)
WBC: 6.6

## 2021-01-15 LAB — COMPREHENSIVE METABOLIC PANEL
Albumin: 4.6 (ref 3.5–5.0)
Calcium: 9.9 (ref 8.7–10.7)
GFR calc Af Amer: 110
GFR calc non Af Amer: 95

## 2021-01-15 LAB — HEPATIC FUNCTION PANEL
ALT: 13 (ref 7–35)
AST: 13 (ref 13–35)
Alkaline Phosphatase: 73 (ref 25–125)
Bilirubin, Direct: 0.1 (ref 0.01–0.4)
Bilirubin, Total: 0.3

## 2021-01-15 LAB — CBC: RBC: 4.45 (ref 3.87–5.11)

## 2021-01-15 LAB — TSH: TSH: 1.14 (ref 0.41–5.90)

## 2021-01-19 ENCOUNTER — Encounter: Payer: Self-pay | Admitting: Family Medicine

## 2021-01-20 ENCOUNTER — Encounter: Payer: Self-pay | Admitting: Family Medicine

## 2021-01-23 ENCOUNTER — Encounter (HOSPITAL_COMMUNITY): Payer: Self-pay

## 2021-01-23 ENCOUNTER — Ambulatory Visit (HOSPITAL_COMMUNITY): Payer: Managed Care, Other (non HMO)

## 2021-02-02 ENCOUNTER — Ambulatory Visit (HOSPITAL_COMMUNITY)
Admission: RE | Admit: 2021-02-02 | Discharge: 2021-02-02 | Disposition: A | Discharge status: Home / Self Care | Payer: Managed Care, Other (non HMO) | Source: Ambulatory Visit | Attending: Gastroenterology | Admitting: Gastroenterology

## 2021-02-02 ENCOUNTER — Other Ambulatory Visit: Payer: Self-pay

## 2021-02-02 DIAGNOSIS — R1011 Right upper quadrant pain: Secondary | ICD-10-CM | POA: Insufficient documentation

## 2021-02-11 ENCOUNTER — Telehealth: Payer: Self-pay

## 2021-02-11 MED ORDER — AMPHETAMINE-DEXTROAMPHET ER 20 MG PO CP24: 20.0000 mg | ORAL_CAPSULE | ORAL | 0 refills | Status: DC

## 2021-02-11 NOTE — Telephone Encounter (Signed)
.   LAST APPOINTMENT DATE: 1/27//2022   NEXT APPOINTMENT DATE:@4 /27/2022  MEDICATION:amphetamine-dextroamphetamine (ADDERALL XR) 20 MG 24 hr capsule   PHARMACY:[facility] - [place], [place] - [address]  Let patient know to contact pharmacy at the end of the day to make sure medication is ready.  Please notify patient to allow 48-72 hours to process  Encourage patient to contact the pharmacy for refills or they can request refills through [facility]:   LAST REFILL:  QTY:  REFILL DATE:    OTHER COMMENTS:    Okay for refill?  Please advise

## 2021-02-16 ENCOUNTER — Ambulatory Visit (HOSPITAL_COMMUNITY)
Admission: RE | Admit: 2021-02-16 | Discharge: 2021-02-16 | Disposition: A | Discharge status: Home / Self Care | Payer: Managed Care, Other (non HMO) | Source: Ambulatory Visit | Attending: Gastroenterology | Admitting: Gastroenterology

## 2021-02-16 ENCOUNTER — Other Ambulatory Visit: Payer: Self-pay

## 2021-02-16 DIAGNOSIS — R1011 Right upper quadrant pain: Secondary | ICD-10-CM | POA: Insufficient documentation

## 2021-02-16 MED ORDER — TECHNETIUM TC 99M MEBROFENIN IV KIT
5.0000 | PACK | Freq: Once | INTRAVENOUS | Status: AC | PRN
  Administered 2021-02-16: 5 via INTRAVENOUS

## 2021-02-17 ENCOUNTER — Other Ambulatory Visit: Payer: Self-pay | Admitting: Gastroenterology

## 2021-02-17 DIAGNOSIS — R1011 Right upper quadrant pain: Secondary | ICD-10-CM

## 2021-02-17 DIAGNOSIS — R634 Abnormal weight loss: Secondary | ICD-10-CM

## 2021-03-04 ENCOUNTER — Ambulatory Visit
Admission: RE | Admit: 2021-03-04 | Discharge: 2021-03-04 | Disposition: A | Discharge status: Home / Self Care | Payer: Managed Care, Other (non HMO) | Source: Ambulatory Visit | Attending: Gastroenterology | Admitting: Gastroenterology

## 2021-03-04 DIAGNOSIS — R634 Abnormal weight loss: Secondary | ICD-10-CM

## 2021-03-04 DIAGNOSIS — R1011 Right upper quadrant pain: Secondary | ICD-10-CM

## 2021-03-04 MED ORDER — IOPAMIDOL (ISOVUE-370) INJECTION 76%
70.0000 mL | Freq: Once | INTRAVENOUS | Status: AC | PRN
  Administered 2021-03-04: 70 mL via INTRAVENOUS

## 2021-03-09 ENCOUNTER — Other Ambulatory Visit: Payer: Self-pay | Admitting: Orthopedic Surgery

## 2021-03-09 DIAGNOSIS — Y9323 Activity, snow (alpine) (downhill) skiing, snow boarding, sledding, tobogganing and snow tubing: Secondary | ICD-10-CM

## 2021-03-09 DIAGNOSIS — M25561 Pain in right knee: Secondary | ICD-10-CM

## 2021-03-10 ENCOUNTER — Ambulatory Visit
Admission: RE | Admit: 2021-03-10 | Discharge: 2021-03-10 | Disposition: A | Discharge status: Home / Self Care | Payer: Managed Care, Other (non HMO) | Source: Ambulatory Visit | Attending: Orthopedic Surgery | Admitting: Orthopedic Surgery

## 2021-03-10 DIAGNOSIS — M25561 Pain in right knee: Secondary | ICD-10-CM

## 2021-03-10 DIAGNOSIS — Y9323 Activity, snow (alpine) (downhill) skiing, snow boarding, sledding, tobogganing and snow tubing: Secondary | ICD-10-CM

## 2021-03-18 ENCOUNTER — Ambulatory Visit: Payer: Managed Care, Other (non HMO) | Admitting: Endocrinology

## 2021-03-18 ENCOUNTER — Telehealth: Payer: Self-pay | Admitting: Endocrinology

## 2021-03-18 NOTE — Telephone Encounter (Signed)
Isabella Singh, I'm just now seeing this and Isabella Singh would be the only one who could override and place patient in that spot.

## 2021-03-18 NOTE — Telephone Encounter (Signed)
Patient had appt this morning and when she was on her way she ran over a nail that got stuck in her tire - so I've rescheduled her to the next available and added her to the wait list.  But she wanted to see about Dr Loanne Drilling possibly ordering an ultrasound for her? She was going to ask him about it today when she saw him. But she's had weight loss, she feels like her lymph nodes are swollen, she has pain that comes and goes on her left side of neck - says when she feels that, she also sees a rash on her neck. She feels it from under her chin up to her scalp and it feels "tight". She was not sure if she should see and ENT or if it was her thyroid. Please advise. Ph# [phone].

## 2021-03-19 NOTE — Telephone Encounter (Signed)
Noted  

## 2021-03-25 ENCOUNTER — Other Ambulatory Visit: Payer: Self-pay | Admitting: Family Medicine

## 2021-03-25 NOTE — Telephone Encounter (Signed)
Pt is requesting Adderal XL 20 mg  LOV: 01/01/2021 Next Visit: 04/01/2021  Please Advise.

## 2021-03-31 DIAGNOSIS — N643 Galactorrhea not associated with childbirth: Secondary | ICD-10-CM | POA: Insufficient documentation

## 2021-04-01 ENCOUNTER — Ambulatory Visit (INDEPENDENT_AMBULATORY_CARE_PROVIDER_SITE_OTHER): Payer: Managed Care, Other (non HMO)

## 2021-04-01 ENCOUNTER — Encounter: Payer: Self-pay | Admitting: Family Medicine

## 2021-04-01 ENCOUNTER — Other Ambulatory Visit: Payer: Self-pay

## 2021-04-01 ENCOUNTER — Ambulatory Visit (INDEPENDENT_AMBULATORY_CARE_PROVIDER_SITE_OTHER): Payer: Managed Care, Other (non HMO) | Admitting: Family Medicine

## 2021-04-01 VITALS — BP 90/60 | HR 83 | Temp 97.2°F | Ht 66.0 in | Wt 133.8 lb

## 2021-04-01 DIAGNOSIS — E559 Vitamin D deficiency, unspecified: Secondary | ICD-10-CM

## 2021-04-01 DIAGNOSIS — Z0000 Encounter for general adult medical examination without abnormal findings: Secondary | ICD-10-CM

## 2021-04-01 DIAGNOSIS — F988 Other specified behavioral and emotional disorders with onset usually occurring in childhood and adolescence: Secondary | ICD-10-CM | POA: Diagnosis not present

## 2021-04-01 DIAGNOSIS — Z114 Encounter for screening for human immunodeficiency virus [HIV]: Secondary | ICD-10-CM

## 2021-04-01 DIAGNOSIS — M542 Cervicalgia: Secondary | ICD-10-CM

## 2021-04-01 DIAGNOSIS — R002 Palpitations: Secondary | ICD-10-CM | POA: Diagnosis not present

## 2021-04-01 DIAGNOSIS — Z1159 Encounter for screening for other viral diseases: Secondary | ICD-10-CM

## 2021-04-01 MED ORDER — AMPHETAMINE-DEXTROAMPHET ER 10 MG PO CP24: 10.0000 mg | ORAL_CAPSULE | Freq: Every day | ORAL | 0 refills | Status: DC

## 2021-04-01 NOTE — Patient Instructions (Signed)
1) holter monitor ordered for palpitations. They will call you for this.  2) decreasing adderall down to 56m/day to see if this helps heart rate/weight. F/u in 3 months with alyssa  3) neck spasms-would see ortho/neurosurgery. ? Tight ligament/muscle. Trial muscle relaxer  Preventive Care [age]Years Old, Female Preventive care refers to lifestyle choices and visits with your health care provider that can promote health and wellness. This includes:  A yearly physical exam. This is also called an annual wellness visit.  Regular dental and eye exams.  Immunizations.  Screening for certain conditions.  Healthy lifestyle choices, such as: ? Eating a healthy diet. ? Getting regular exercise. ? Not using drugs or products that contain nicotine and tobacco. ? Limiting alcohol use. What can I expect for my preventive care visit? Physical exam Your health care provider will check your:  Height and weight. These may be used to calculate your BMI (body mass index). BMI is a measurement that tells if you are at a healthy weight.  Heart rate and blood pressure.  Body temperature.  Skin for abnormal spots. Counseling Your health care provider may ask you questions about your:  Past medical problems.  Family's medical history.  Alcohol, tobacco, and drug use.  Emotional well-being.  Home life and relationship well-being.  Sexual activity.  Diet, exercise, and sleep habits.  Work and work eStatistician  Access to firearms.  Method of birth control.  Menstrual cycle.  Pregnancy history. What immunizations do I need? Vaccines are usually given at various ages, according to a schedule. Your health care provider will recommend vaccines for you based on your age, medical history, and lifestyle or other factors, such as travel or where you work.   What tests do I need? Blood tests  Lipid and cholesterol levels. These may be checked every 5 years, or more often if you are over  41years old.  Hepatitis C test.  Hepatitis B test. Screening  Lung cancer screening. You may have this screening every year starting at age 413if you have a 30-pack-year history of smoking and currently smoke or have quit within the past 15 years.  Colorectal cancer screening. ? All adults should have this screening starting at age 4132and continuing until age 41 ? Your health care provider may recommend screening at age 4151if you are at increased risk. ? You will have tests every 1-10 years, depending on your results and the type of screening test.  Diabetes screening. ? This is done by checking your blood sugar (glucose) after you have not eaten for a while (fasting). ? You may have this done every 1-3 years.  Mammogram. ? This may be done every 1-2 years. ? Talk with your health care provider about when you should start having regular mammograms. This may depend on whether you have a family history of breast cancer.  BRCA-related cancer screening. This may be done if you have a family history of breast, ovarian, tubal, or peritoneal cancers.  Pelvic exam and Pap test. ? This may be done every 3 years starting at age 41 ? Starting at age 41 this may be done every 5 years if you have a Pap test in combination with an HPV test. Other tests  STD (sexually transmitted disease) testing, if you are at risk.  Bone density scan. This is done to screen for osteoporosis. You may have this scan if you are at high risk for osteoporosis. Talk with your health care provider about  your test results, treatment options, and if necessary, the need for more tests. Follow these instructions at home: Eating and drinking  Eat a diet that includes fresh fruits and vegetables, whole grains, lean protein, and low-fat dairy products.  Take vitamin and mineral supplements as recommended by your health care provider.  Do not drink alcohol if: ? Your health care provider tells you not to  drink. ? You are pregnant, may be pregnant, or are planning to become pregnant.  If you drink alcohol: ? Limit how much you have to 0-1 drink a day. ? Be aware of how much alcohol is in your drink. In the [place] one drink equals one 12 oz bottle of beer (355 mL), one 5 oz glass of wine (148 mL), or one 1 oz glass of hard liquor (44 mL).   Lifestyle  Take daily care of your teeth and gums. Brush your teeth every morning and night with fluoride toothpaste. Floss one time each day.  Stay active. Exercise for at least 30 minutes 5 or more days each week.  Do not use any products that contain nicotine or tobacco, such as cigarettes, e-cigarettes, and chewing tobacco. If you need help quitting, ask your health care provider.  Do not use drugs.  If you are sexually active, practice safe sex. Use a condom or other form of protection to prevent STIs (sexually transmitted infections).  If you do not wish to become pregnant, use a form of birth control. If you plan to become pregnant, see your health care provider for a prepregnancy visit.  If told by your health care provider, take low-dose aspirin daily starting at age 38.  Find healthy ways to cope with stress, such as: ? Meditation, yoga, or listening to music. ? Journaling. ? Talking to a trusted person. ? Spending time with friends and family. Safety  Always wear your seat belt while driving or riding in a vehicle.  Do not drive: ? If you have been drinking alcohol. Do not ride with someone who has been drinking. ? When you are tired or distracted. ? While texting.  Wear a helmet and other protective equipment during sports activities.  If you have firearms in your house, make sure you follow all gun safety procedures. What's next?  Visit your health care provider once a year for an annual wellness visit.  Ask your health care provider how often you should have your eyes and teeth checked.  Stay up to date on all  vaccines. This information is not intended to replace advice given to you by your health care provider. Make sure you discuss any questions you have with your health care provider. Document Revised: 08/26/2020 Document Reviewed: 08/03/2018 Elsevier Patient Education  2021 [employer].

## 2021-04-01 NOTE — Progress Notes (Signed)
Patient: Isabella Singh MRN: [id number] DOB: 10-28-1981 PCP: Orma Flaming, MD     Subjective:  Chief Complaint  Patient presents with  . Annual Exam  . ADD    Pt would like to discuss medications making her heart race.  . Neck Pain  . Palpitations    HPI: The patient is a 40 y.o. female who presents today for annual exam. She denies any changes to past medical history. There have been no recent hospitalizations. They are  following a well balanced diet and exercise plan. Weight has been stable. She is also here for her 3 month ADD f/u and has complaints of palpitations and her apple watch reading a fib x 1 episode back in march. She did tear her MCL skiing and is in PT. She has eaten this morning.   No first degree with breast or colon cancer. Paternal uncle with colon cancer. Has had mmg for watching some cysts.   ADD Currently on Adderall XR 20mg /day. She uses this for ADD and for hypersolemnence, but continues to lose weight and has had some episodes of palpitations. She had started to losing weight before she started adderall XR. Also will do her drug contract and yearly UDS today. She does feel like this helps her and she can get through the day. No headaches, diarrhea and sleeping fine with medication even if she takes it later than normal.   Palpitations -had one episode of palpitations in march and her apple watch afib, but isolated even and has not happened again. She had no shortness of breath or sweating with this episode. No leg swelling or cough. The highest her heart rate has ever gone was 105. TSH just checked in February and was wnl. Has had hx of thyroiditis and followed by endocrinology. Is on the adderall XR as well. She has MVP and has done a stress test, but no holter.   Neck spasms She has a prodisc implant that was done 07/2017 for osteophytes. She has hypermobility syndrome and likely contributed. She states now she has episodes where her neck will have intense  spasms and she can physically see muscle get enlarged (SCM). It will seize up and working on muscles doesn't seem to help. She will have this flair and can last up to 2 weeks time. ENT told her it was lymph nodes. She wasn't sick around that time at all. They have resolved, but she still feels tightness.    There is no immunization history on file for this patient.  Colonoscopy: routine screening  Mammogram: gets these routinely  Pap smear: N/A no cervix s/p hysterectomy for endometriosis.     Review of Systems  Constitutional: Negative for chills, fatigue and fever.  HENT: Negative for dental problem, ear pain, hearing loss and trouble swallowing.   Eyes: Negative for visual disturbance.  Respiratory: Negative for cough, chest tightness and shortness of breath.   Cardiovascular: Positive for palpitations. Negative for chest pain and leg swelling.  Gastrointestinal: Negative for abdominal pain, blood in stool, diarrhea and nausea.  Endocrine: Negative for cold intolerance, polydipsia, polyphagia and polyuria.  Genitourinary: Negative for dysuria and hematuria.  Musculoskeletal: Positive for neck pain. Negative for arthralgias.  Skin: Negative for rash.  Neurological: Negative for dizziness and headaches.  Psychiatric/Behavioral: Negative for agitation, confusion, dysphoric mood and sleep disturbance. The patient is not nervous/anxious.     Allergies Patient is allergic to cyclobenzaprine, sulfa antibiotics, and gluten meal.  Past Medical History Patient  has a past  medical history of Elevated antinuclear antibody (ANA) level, Endometriosis, and Epstein Barr infection.  Surgical History Patient  has a past surgical history that includes Partial hysterectomy (05/2017); Excision of endometrioma (10/2017); Appendectomy (10/2017); and Neck surgery (07/2017).  Family History Pateint's family history includes Allergies in her brother, mother, and sister; Arthritis in her mother and  sister; Asthma in her mother; Headache in her brother, father, mother, and sister; Stroke in her maternal grandmother and paternal grandmother; Tuberculosis in an other family member.  Social History Patient  reports that she has never smoked. She has never used smokeless tobacco. She reports current alcohol use. She reports that she does not use drugs.    Objective: Vitals:   04/01/21 1008  BP: 90/60  Pulse: 83  Temp: (!) 97.2 F (36.2 C)  TempSrc: Temporal  SpO2: 100%  Weight: 133 lb 12.8 oz (60.7 kg)  Height: 5\' 6"  (1.676 m)    Body mass index is 21.6 kg/m.  Physical Exam Vitals reviewed.  Constitutional:      Appearance: Normal appearance. She is well-developed and normal weight.  HENT:     Head: Normocephalic and atraumatic.     Right Ear: Tympanic membrane, ear canal and external ear normal.     Left Ear: Tympanic membrane, ear canal and external ear normal.     Nose: Nose normal.     Mouth/Throat:     Mouth: Mucous membranes are moist.  Eyes:     Extraocular Movements: Extraocular movements intact.     Conjunctiva/sclera: Conjunctivae normal.     Pupils: Pupils are equal, round, and reactive to light.  Neck:     Thyroid: No thyromegaly.  Cardiovascular:     Rate and Rhythm: Normal rate and regular rhythm.     Pulses: Normal pulses.     Heart sounds: Normal heart sounds. No murmur heard.   Pulmonary:     Effort: Pulmonary effort is normal.     Breath sounds: Normal breath sounds.  Abdominal:     General: Abdomen is flat. Bowel sounds are normal. There is no distension.     Palpations: Abdomen is soft.     Tenderness: There is no abdominal tenderness.  Musculoskeletal:     Cervical back: Normal range of motion and neck supple.     Comments: Left tight SCM. ttp over this area.   Lymphadenopathy:     Cervical: No cervical adenopathy.  Skin:    General: Skin is warm and dry.     Capillary Refill: Capillary refill takes less than 2 seconds.     Findings: No  rash.  Neurological:     General: No focal deficit present.     Mental Status: She is alert and oriented to person, place, and time.     Cranial Nerves: No cranial nerve deficit.     Coordination: Coordination normal.     Deep Tendon Reflexes: Reflexes normal.  Psychiatric:        Mood and Affect: Mood normal.        Behavior: Behavior normal.      [place] Office Visit from 01/01/2021 in [place]  PHQ-2 Total Score 0     Ekg: NSR.     Assessment/plan: 1. Annual physical exam Routine labs today. She is not fasting. TSH just checked and has follow up with her endocrinologist soon. Fit and exercising regularly. Eats very clean. HM reviewed and UTD>  - Lipid panel - CBC with Differential/Platelet - Comprehensive metabolic panel  2. Attention deficit disorder (ADD) without hyperactivity With weight loss and palpitations we are going to decrease this down to 10mg /day. discussed even changing medication, but it works well for her so will stay the course and decrease medication dosage to see if helps with symptoms. F/u in 3 months.  -drug contract done today and yearly UDS -pmp website reviewed and filling correctly  - Pain Management Screening Profile (10S)  3. Palpitations Normal EKG and isolated event on apple watch. She does have hx of palpitations and thyroiditis. TSH wnl recently. Will order holter for her since has been bothering her more recently. Decreasing her adderall dosage down as well to see if this helps too.  - EKG 12-Lead - LONG TERM MONITOR (3-14 DAYS); Future  4. Neck pain recommended f/u with ortho as she may have tight ligament/muscle, but she will look up providers and see who she would like to use. Tight SCM. Has muscle relaxer and will try this as well.   5. Encounter for screening for HIV  - HIV Antibody (routine testing w rflx)  6. Encounter for hepatitis C screening test for low risk patient  - Hepatitis C  antibody  7. Vitamin D deficiency  - VITAMIN D 25 Hydroxy (Vit-D Deficiency, Fractures)   Return in about 3 months (around 07/01/2021) for add recheck/toc with alyssa .     Orma Flaming, MD [place]  04/01/2021

## 2021-04-09 DIAGNOSIS — R002 Palpitations: Secondary | ICD-10-CM | POA: Diagnosis not present

## 2021-04-28 ENCOUNTER — Ambulatory Visit: Payer: Managed Care, Other (non HMO) | Admitting: Endocrinology

## 2021-07-08 ENCOUNTER — Other Ambulatory Visit: Payer: Self-pay | Admitting: Family Medicine

## 2021-07-11 NOTE — Telephone Encounter (Signed)
She needs a follow up plan- who will be her new provider and when is she scheduled. ADD med refills require visit after 6 months (at least how I was taught)

## 2021-07-13 ENCOUNTER — Telehealth: Payer: Self-pay

## 2021-07-13 NOTE — Telephone Encounter (Signed)
Error

## 2021-07-13 NOTE — Telephone Encounter (Signed)
Received a call from the pharmacy stating patient is needing a refill, advised them I would send a message as well as call the patient to get scheduled for a TOC for at least a visit. LVM for patient to call back and get scheduled.

## 2021-07-14 NOTE — Telephone Encounter (Signed)
Pt requesting Adderall XR 10 mg 24 hr cap LOV: 04/01/2021 No future visits scheduled Last refill: 05/11/2021  Please advise.

## 2021-07-14 NOTE — Telephone Encounter (Signed)
Duplicate

## 2021-07-14 NOTE — Telephone Encounter (Signed)
Please see my note below- she needs to be scheduled for her TOC before I refill this so we have a long term plan together for ehr

## 2021-07-15 NOTE — Telephone Encounter (Signed)
LVM for patient to call back to get scheduled for TOC.

## 2021-07-15 NOTE — Telephone Encounter (Signed)
Please schedule pt for TOC to establish care with new PCP.  Thank you!

## 2021-07-22 NOTE — Telephone Encounter (Signed)
See message below °

## 2021-07-24 ENCOUNTER — Other Ambulatory Visit: Payer: Self-pay

## 2021-07-27 ENCOUNTER — Telehealth: Payer: Self-pay

## 2021-07-27 NOTE — Telephone Encounter (Signed)
MEDICATION: amphetamine-dextroamphetamine (ADDERALL XR) 10 MG 24 hr capsule  PHARMACY:  [facility] - [place], [place] - [address] Phone:  [phone]  Fax:  [phone]      Comments: Patient is scheduled for 09/18/21  **Let patient know to contact pharmacy at the end of the day to make sure medication is ready. **  ** Please notify patient to allow 48-72 hours to process**  **Encourage patient to contact the pharmacy for refills or they can request refills through [facility]**

## 2021-07-28 ENCOUNTER — Other Ambulatory Visit: Payer: Self-pay | Admitting: Physician Assistant

## 2021-07-28 MED ORDER — AMPHETAMINE-DEXTROAMPHET ER 10 MG PO CP24: 10.0000 mg | ORAL_CAPSULE | Freq: Every day | ORAL | 0 refills | Status: DC

## 2021-07-29 NOTE — Telephone Encounter (Signed)
Alyssa, you already sent Rx in please refuse Rx.

## 2021-08-26 ENCOUNTER — Ambulatory Visit (INDEPENDENT_AMBULATORY_CARE_PROVIDER_SITE_OTHER): Payer: Managed Care, Other (non HMO) | Admitting: Physician Assistant

## 2021-08-26 ENCOUNTER — Encounter: Payer: Self-pay | Admitting: Physician Assistant

## 2021-08-26 VITALS — BP 101/66 | HR 87 | Temp 98.3°F | Ht 66.0 in | Wt 139.4 lb

## 2021-08-26 DIAGNOSIS — E042 Nontoxic multinodular goiter: Secondary | ICD-10-CM | POA: Diagnosis not present

## 2021-08-26 DIAGNOSIS — M542 Cervicalgia: Secondary | ICD-10-CM | POA: Diagnosis not present

## 2021-08-26 DIAGNOSIS — F988 Other specified behavioral and emotional disorders with onset usually occurring in childhood and adolescence: Secondary | ICD-10-CM | POA: Diagnosis not present

## 2021-08-26 MED ORDER — AMPHETAMINE-DEXTROAMPHET ER 15 MG PO CP24: 15.0000 mg | ORAL_CAPSULE | ORAL | 0 refills | Status: DC

## 2021-08-26 MED ORDER — AMPHETAMINE-DEXTROAMPHET ER 15 MG PO CP24
15.0000 mg | ORAL_CAPSULE | ORAL | 0 refills | Status: DC
Start: 2021-10-25 — End: 2022-03-03

## 2021-08-26 NOTE — Patient Instructions (Signed)
Increase Adderall XR back to 15 mg daily. Refills sent for 3 months. [facility] - they should call you. Order for US thyroid placed as well.  F/up in 3 months if you do not get in with [facility] by then.

## 2021-08-26 NOTE — Progress Notes (Signed)
Subjective:    Patient ID: Isabella Singh, female    DOB: 07/14/1981, 40 y.o.   MRN: [id number]  Chief Complaint  Patient presents with   Transitions Of Care   Medication Problem    HPI Patient is in today for [facility] from Dr. Rogers Blocker and medication management.  She is taking Adderall XR 10 mg daily for ADHD and hypersomnia. She has been taking this since around 2020. States she has had lifelong issues with this, but never was medicated in her youth. She has had sleep study done and consulted with neurology, which she says was unremarkable. At last visit with Dr. Rogers Blocker the dose was changed from 15 to 10 mg because she was having some palpitations, but patient says she has not seen any changes except for a decrease in the efficacy of the medication for her symptoms.  She also has a thyroid goiter and is wondering when her last ultrasound was.  She is supposed to be following up with endocrinology but does not have an appointment scheduled.  She feels like there is swelling in her neck and she is going to try to get with a chiropractor.  Past Medical History:  Diagnosis Date   Elevated antinuclear antibody (ANA) level    Endometriosis    sx 2008 (x1), 2018 (x2)   Epstein Barr infection    hx    Past Surgical History:  Procedure Laterality Date   APPENDECTOMY  10/2017   EXCISION OF ENDOMETRIOMA  10/2017   NECK SURGERY  07/2017   c5 c6   PARTIAL HYSTERECTOMY  05/2017    Family History  Problem Relation Age of Onset   Allergies Mother    Arthritis Mother    Asthma Mother    Headache Mother    Headache Father    Headache Sister    Allergies Sister    Arthritis Sister    Allergies Brother    Headache Brother    Stroke Maternal Grandmother    Stroke Paternal Grandmother    Tuberculosis Other    Thyroid disease Neg Hx     Social History   Tobacco Use   Smoking status: Never   Smokeless tobacco: Never  Vaping Use   Vaping Use: Never used  Substance Use Topics   Alcohol  use: Yes    Comment: occasion   Drug use: Never     Allergies  Allergen Reactions   Cyclobenzaprine Rash   Sulfa Antibiotics Rash   Gluten Meal Nausea Only and Other (See Comments)    Other reaction(s): GI Upset (intolerance), Lethargy (intolerance), Malaise (intolerance)    Review of Systems REFER TO HPI FOR PERTINENT POSITIVES AND NEGATIVES      Objective:     BP 101/66   Pulse 87   Temp 98.3 F (36.8 C)   Ht 5\' 6"  (1.676 m)   Wt 139 lb 6.1 oz (63.2 kg)   SpO2 98%   BMI 22.50 kg/m   Wt Readings from Last 3 Encounters:  08/26/21 139 lb 6.1 oz (63.2 kg)  04/01/21 133 lb 12.8 oz (60.7 kg)  01/01/21 130 lb 9.6 oz (59.2 kg)    BP Readings from Last 3 Encounters:  08/26/21 101/66  04/01/21 90/60  01/01/21 (!) 86/60     Physical Exam Vitals reviewed.  Constitutional:      Appearance: Normal appearance. She is well-developed and normal weight.  HENT:     Head: Normocephalic and atraumatic.     Right Ear: External  ear normal.     Left Ear: External ear normal.     Nose: Nose normal.     Mouth/Throat:     Mouth: Mucous membranes are moist.  Eyes:     Extraocular Movements: Extraocular movements intact.     Conjunctiva/sclera: Conjunctivae normal.     Pupils: Pupils are equal, round, and reactive to light.  Neck:     Thyroid: Thyromegaly present.  Cardiovascular:     Rate and Rhythm: Normal rate and regular rhythm.     Pulses: Normal pulses.     Heart sounds: Normal heart sounds. No murmur heard. Pulmonary:     Effort: Pulmonary effort is normal.     Breath sounds: Normal breath sounds.  Abdominal:     General: Abdomen is flat. Bowel sounds are normal. There is no distension.     Palpations: Abdomen is soft.     Tenderness: There is no abdominal tenderness.  Musculoskeletal:     Cervical back: Normal range of motion and neck supple.     Comments: No swelling or tenderness, full ROM  Lymphadenopathy:     Cervical: No cervical adenopathy.     Right  cervical: No superficial cervical adenopathy.    Left cervical: No superficial cervical adenopathy.  Skin:    General: Skin is warm and dry.     Capillary Refill: Capillary refill takes less than 2 seconds.     Findings: No rash.  Neurological:     General: No focal deficit present.     Mental Status: She is alert and oriented to person, place, and time.     Cranial Nerves: No cranial nerve deficit.     Coordination: Coordination normal.     Deep Tendon Reflexes: Reflexes normal.  Psychiatric:        Mood and Affect: Mood normal.        Behavior: Behavior normal.       Assessment & Plan:   Problem List Items Addressed This Visit       Endocrine   Multinodular goiter   Relevant Orders   US THYROID     Other   Neck pain   Attention deficit disorder - Primary   Relevant Orders   Ambulatory referral to Psychiatry     Meds ordered this encounter  Medications   amphetamine-dextroamphetamine (ADDERALL XR) 15 MG 24 hr capsule    Sig: Take 1 capsule by mouth every morning.    Dispense:  30 capsule    Refill:  0   amphetamine-dextroamphetamine (ADDERALL XR) 15 MG 24 hr capsule    Sig: Take 1 capsule by mouth every morning.    Dispense:  30 capsule    Refill:  0   amphetamine-dextroamphetamine (ADDERALL XR) 15 MG 24 hr capsule    Sig: Take 1 capsule by mouth every morning.    Dispense:  30 capsule    Refill:  0    1. Attention deficit disorder (ADD) without hyperactivity -PDMP reviewed and no red flags.  Patient filling appropriately. -Refilled Adderall XR 15 mg for the next 3 months. -I do think she needs to follow-up with [place] [facility] and have formal testing done.  Send this referral today.  2. Neck pain -This is ongoing.  Patient is going to try chiropractic therapy.  She may benefit from a muscle relaxer or some PT in the future.  3. Multinodular goiter -Last ultrasound was done 01/11/2020.  She feels like it is bigger than normal and we will go  ahead and get a repeat ultrasound scheduled.   This note was prepared with assistance of Systems analyst. Occasional wrong-word or sound-a-like substitutions may have occurred due to the inherent limitations of voice recognition software.  Time Spent: 35 minutes of total time was spent on the date of the encounter performing the following actions: chart review prior to seeing the patient, obtaining history, performing a medically necessary exam, counseling on the treatment plan, placing orders, and documenting in our EHR.    Khayman Kirsch M Rayssa Atha, PA-C

## 2021-08-28 ENCOUNTER — Ambulatory Visit
Admission: RE | Admit: 2021-08-28 | Discharge: 2021-08-28 | Disposition: A | Discharge status: Home / Self Care | Payer: Managed Care, Other (non HMO) | Source: Ambulatory Visit | Attending: Physician Assistant | Admitting: Physician Assistant

## 2021-08-28 DIAGNOSIS — E042 Nontoxic multinodular goiter: Secondary | ICD-10-CM

## 2021-09-18 ENCOUNTER — Encounter: Payer: Managed Care, Other (non HMO) | Admitting: Physician Assistant

## 2022-02-05 ENCOUNTER — Other Ambulatory Visit: Payer: Self-pay | Admitting: Physician Assistant

## 2022-03-03 ENCOUNTER — Encounter: Payer: Self-pay | Admitting: Physician Assistant

## 2022-03-03 ENCOUNTER — Telehealth (INDEPENDENT_AMBULATORY_CARE_PROVIDER_SITE_OTHER): Payer: Managed Care, Other (non HMO) | Admitting: Physician Assistant

## 2022-03-03 DIAGNOSIS — U071 COVID-19: Secondary | ICD-10-CM

## 2022-03-03 DIAGNOSIS — F988 Other specified behavioral and emotional disorders with onset usually occurring in childhood and adolescence: Secondary | ICD-10-CM

## 2022-03-03 MED ORDER — AMPHETAMINE-DEXTROAMPHETAMINE 15 MG PO TABS: 15.0000 mg | ORAL_TABLET | Freq: Every day | ORAL | 0 refills | Status: DC

## 2022-03-03 MED ORDER — ALBUTEROL SULFATE (2.5 MG/3ML) 0.083% IN NEBU: 2.5000 mg | INHALATION_SOLUTION | Freq: Four times a day (QID) | RESPIRATORY_TRACT | 1 refills | Status: DC | PRN

## 2022-03-03 NOTE — Progress Notes (Signed)
? ?  Virtual Visit via Video Note ? ?I connected with  Isabella Singh  on 03/03/22 at 11:30 AM EDT by a video enabled telemedicine application and verified that I am speaking with the correct person using two identifiers. ? ?Location: ?Patient: home ?Provider: Therapist, music at [employer] ?Persons present: Patient and myself ?  ?I discussed the limitations of evaluation and management by telemedicine and the availability of in person appointments. The patient expressed understanding and agreed to proceed.  ? ?History of Present Illness: ? ?41 year old female presents for virtual visit to discuss ADHD medication.  She is at home with COVID 19 , doing well overall but requesting refill albuterol nebs. ? ?Last time she took anything for ADHD was Feb 9th. This was Adderall XR 15 mg. She feels like the extended release is lasting too long. Wondering if it is starting to affect her sleep. Wanting to trial a shorter acting version of adderall. ?Does not normally have a problem sleeping, hx of hypersomnia.  ?  ?Observations/Objective: ? ? ?Gen: Awake, alert, no acute distress ?Resp: Breathing is even and non-labored ?Psych: calm/pleasant demeanor ?Neuro: Alert and Oriented x 3, + facial symmetry, speech is clear. ? ? ?Assessment and Plan: ? ?1. Attention deficit disorder (ADD) without hyperactivity ?PDMP reviewed today, no red flags, filling appropriately.  ?Trial Adderall 15 mg IR instead of XR this month. #30 sent. Will see if this helps her sleeping issues.  ?F/up 4 weeks ? ?2. COVID-19 ?Mild illness, no concerns. Rx albuterol nebs have been helping, refilled today. Pt to keep me updated how she is doing.  ? ? ?Follow Up Instructions: ? ?  ?I discussed the assessment and treatment plan with the patient. The patient was provided an opportunity to ask questions and all were answered. The patient agreed with the plan and demonstrated an understanding of the instructions. ?  ?The patient was advised to call  back or seek an in-person evaluation if the symptoms worsen or if the condition fails to improve as anticipated. ? ?Adriano Bischof M Odessa Morren, PA-C ? ?

## 2022-04-14 ENCOUNTER — Other Ambulatory Visit: Payer: Self-pay | Admitting: Physician Assistant

## 2022-04-20 DIAGNOSIS — M542 Cervicalgia: Secondary | ICD-10-CM | POA: Insufficient documentation

## 2022-05-18 ENCOUNTER — Other Ambulatory Visit: Payer: Self-pay | Admitting: Physician Assistant

## 2022-05-18 NOTE — Telephone Encounter (Signed)
Last visit: 08/26/21  Next visit: 03/03/22  VV  Last filled: 07/28/21  Quantity: 30

## 2022-05-19 ENCOUNTER — Other Ambulatory Visit: Payer: Self-pay

## 2022-05-19 ENCOUNTER — Emergency Department (HOSPITAL_BASED_OUTPATIENT_CLINIC_OR_DEPARTMENT_OTHER)
Admission: EM | Admit: 2022-05-19 | Discharge: 2022-05-20 | Disposition: A | Discharge status: Home / Self Care | Payer: Medicaid Other | Attending: Emergency Medicine | Admitting: Emergency Medicine

## 2022-05-19 DIAGNOSIS — M25552 Pain in left hip: Secondary | ICD-10-CM | POA: Insufficient documentation

## 2022-05-19 DIAGNOSIS — R109 Unspecified abdominal pain: Secondary | ICD-10-CM

## 2022-05-19 DIAGNOSIS — J45909 Unspecified asthma, uncomplicated: Secondary | ICD-10-CM | POA: Insufficient documentation

## 2022-05-19 DIAGNOSIS — R1032 Left lower quadrant pain: Secondary | ICD-10-CM | POA: Diagnosis present

## 2022-05-19 LAB — URINALYSIS, ROUTINE W REFLEX MICROSCOPIC
Bilirubin Urine: NEGATIVE
Glucose, UA: NEGATIVE mg/dL
Hgb urine dipstick: NEGATIVE
Ketones, ur: NEGATIVE mg/dL
Leukocytes,Ua: NEGATIVE
Nitrite: NEGATIVE
Protein, ur: NEGATIVE mg/dL
Specific Gravity, Urine: 1.01 (ref 1.005–1.030)
pH: 6 (ref 5.0–8.0)

## 2022-05-19 LAB — COMPREHENSIVE METABOLIC PANEL
ALT: 15 U/L (ref 0–44)
AST: 14 U/L — ABNORMAL LOW (ref 15–41)
Albumin: 4.7 g/dL (ref 3.5–5.0)
Alkaline Phosphatase: 43 U/L (ref 38–126)
Anion gap: 8 (ref 5–15)
BUN: 8 mg/dL (ref 6–20)
CO2: 25 mmol/L (ref 22–32)
Calcium: 10 mg/dL (ref 8.9–10.3)
Chloride: 105 mmol/L (ref 98–111)
Creatinine, Ser: 0.73 mg/dL (ref 0.44–1.00)
GFR, Estimated: >60 mL/min (ref >60)
Glucose, Bld: 92 mg/dL (ref 70–99)
Potassium: 3.8 mmol/L (ref 3.5–5.1)
Sodium: 138 mmol/L (ref 135–145)
Total Bilirubin: 0.3 mg/dL (ref 0.3–1.2)
Total Protein: 7.3 g/dL (ref 6.5–8.1)

## 2022-05-19 LAB — CBC
HCT: 39.3 % (ref 36.0–46.0)
Hemoglobin: 12.8 g/dL (ref 12.0–15.0)
MCH: 30.7 pg (ref 26.0–34.0)
MCHC: 32.6 g/dL (ref 30.0–36.0)
MCV: 94.2 fL (ref 80.0–100.0)
Platelets: 340 10*3/uL (ref 150–400)
RBC: 4.17 MIL/uL (ref 3.87–5.11)
RDW: 12.7 % (ref 11.5–15.5)
WBC: 9.4 10*3/uL (ref 4.0–10.5)
nRBC: 0 % (ref 0.0–0.2)

## 2022-05-19 LAB — LIPASE, BLOOD: Lipase: 51 U/L (ref 11–51)

## 2022-05-19 NOTE — ED Triage Notes (Signed)
Pt c/o left side lower back pain radiation to lower left abdomen ongoing for past 6 days. Initially thought it was back pain worsening past few days with feeling abdomen is tight. Also endorsed foul urine, difficulty emptying bladder, pain worsening with ambulation  HX uterine partial hysterectomy.

## 2022-05-20 ENCOUNTER — Emergency Department (HOSPITAL_BASED_OUTPATIENT_CLINIC_OR_DEPARTMENT_OTHER): Payer: Medicaid Other

## 2022-05-20 MED ORDER — NAPROXEN 500 MG PO TABS: 500.0000 mg | ORAL_TABLET | Freq: Two times a day (BID) | ORAL | 0 refills | Status: DC

## 2022-05-20 MED ORDER — METHOCARBAMOL 500 MG PO TABS: 500.0000 mg | ORAL_TABLET | Freq: Three times a day (TID) | ORAL | 0 refills | Status: DC | PRN

## 2022-05-20 NOTE — Discharge Instructions (Addendum)
Begin taking naproxen as prescribed.  Begin taking Robaxin as prescribed as needed for pain not relieved with naproxen.  Follow-up with your primary doctor if symptoms are not improving in the next week, and return to the ER if symptoms significantly worsen or change.Marland Kitchen

## 2022-05-20 NOTE — ED Provider Notes (Signed)
[facility] Provider Note   CSN: [id number] Arrival date & time: 05/19/22  2150     History  Chief Complaint  Patient presents with   Abdominal Pain    Isabella Singh is a 41 y.o. female.  Patient is a 41 year old female with past medical history of endometriosis with prior hysterectomy, prior appendectomy, interstitial cystitis, asthma.  Patient presenting today with complaints of left flank and left abdominal pain.  This has been worsening over the past week.  She describes a constant pain to the left flank, left hip, and left lower abdomen that is worse when she changes position and ambulates.  She denies any bowel or bladder complaints.  She denies any fevers or chills.  She denies any recent injury, trauma, or new activities.  The history is provided by the patient.       Home Medications Prior to Admission medications   Medication Sig Start Date End Date Taking? Authorizing Provider  albuterol (PROVENTIL) (2.5 MG/3ML) 0.083% nebulizer solution Take 3 mLs (2.5 mg total) by nebulization every 6 (six) hours as needed for wheezing or shortness of breath. 03/03/22   Allwardt, Alyssa M, PA-C  albuterol (VENTOLIN HFA) 108 (90 Base) MCG/ACT inhaler Inhale 2 puffs into the lungs every 6 (six) hours as needed for wheezing or shortness of breath. 01/01/21   Orma Flaming, MD  amphetamine-dextroamphetamine (ADDERALL XR) 10 MG 24 hr capsule Take 1 capsule (10 mg total) by mouth daily. Patient not taking: Reported on 03/03/2022 07/28/21   Allwardt, Randa Evens, PA-C  amphetamine-dextroamphetamine (ADDERALL XR) 15 MG 24 hr capsule Take 1 capsule by mouth every morning. 08/26/21 03/03/22  Allwardt, Alyssa M, PA-C  amphetamine-dextroamphetamine (ADDERALL) 15 MG tablet TAKE 1 TABLET BY MOUTH EVERY DAY 05/18/22   Allwardt, Alyssa M, PA-C      Allergies    Cyclobenzaprine, Sulfa antibiotics, and Gluten meal    Review of Systems   Review of Systems  All other systems  reviewed and are negative.   Physical Exam Updated Vital Signs BP 112/74   Pulse 72   Temp 98.8 F (37.1 C)   Resp 17   Ht '5\' 6"'$  (1.676 m)   Wt 63.5 kg   SpO2 100%   BMI 22.60 kg/m  Physical Exam Vitals and nursing note reviewed.  Constitutional:      General: She is not in acute distress.    Appearance: She is well-developed. She is not diaphoretic.  HENT:     Head: Normocephalic and atraumatic.  Cardiovascular:     Rate and Rhythm: Normal rate and regular rhythm.     Heart sounds: No murmur heard.    No friction rub. No gallop.  Pulmonary:     Effort: Pulmonary effort is normal. No respiratory distress.     Breath sounds: Normal breath sounds. No wheezing.  Abdominal:     General: Bowel sounds are normal. There is no distension.     Palpations: Abdomen is soft.     Tenderness: There is abdominal tenderness in the left lower quadrant. There is left CVA tenderness. There is no right CVA tenderness, guarding or rebound.  Musculoskeletal:        General: Normal range of motion.     Cervical back: Normal range of motion and neck supple.  Skin:    General: Skin is warm and dry.  Neurological:     General: No focal deficit present.     Mental Status: She is alert and oriented to  person, place, and time.     ED Results / Procedures / Treatments   Labs (all labs ordered are listed, but only abnormal results are displayed) Labs Reviewed  COMPREHENSIVE METABOLIC PANEL - Abnormal; Notable for the following components:      Result Value   AST 14 (*)    All other components within normal limits  URINALYSIS, ROUTINE W REFLEX MICROSCOPIC - Abnormal; Notable for the following components:   Color, Urine COLORLESS (*)    All other components within normal limits  LIPASE, BLOOD  CBC    EKG None  Radiology No results found.  Procedures Procedures    Medications Ordered in ED Medications - No data to display  ED Course/ Medical Decision Making/ A&P  Patient  presenting here with complaints of left flank, hip, and abdominal pain, the etiology of which appears musculoskeletal.  Her laboratory studies, urinalysis, and CT scan are basically unremarkable with the exception of degenerative disc disease at L5/S1.  Patient to be discharged with naproxen, muscle relaxers, and follow-up as needed.  Final Clinical Impression(s) / ED Diagnoses Final diagnoses:  None    Rx / DC Orders ED Discharge Orders     None         Veryl Speak, MD 05/20/22 [id number]

## 2022-05-20 NOTE — ED Notes (Signed)
Discharge instructions including prescription and pain management were discussed with pt. Pt verbalized understanding with no questions at this time.  

## 2022-05-24 DIAGNOSIS — M5412 Radiculopathy, cervical region: Secondary | ICD-10-CM | POA: Insufficient documentation

## 2022-05-24 DIAGNOSIS — M5136 Other intervertebral disc degeneration, lumbar region: Secondary | ICD-10-CM | POA: Insufficient documentation

## 2022-05-24 DIAGNOSIS — M961 Postlaminectomy syndrome, not elsewhere classified: Secondary | ICD-10-CM | POA: Insufficient documentation

## 2022-06-15 DIAGNOSIS — M545 Low back pain, unspecified: Secondary | ICD-10-CM | POA: Insufficient documentation

## 2022-06-21 DIAGNOSIS — M5416 Radiculopathy, lumbar region: Secondary | ICD-10-CM | POA: Insufficient documentation

## 2022-06-29 ENCOUNTER — Ambulatory Visit: Payer: Self-pay | Admitting: Orthopedic Surgery

## 2022-06-29 DIAGNOSIS — M5126 Other intervertebral disc displacement, lumbar region: Secondary | ICD-10-CM

## 2022-06-29 NOTE — H&P (Signed)
Isabella Singh is an 41 y.o. female.   Chief Complaint: back and left leg pain HPI: Location: left; low back Quality: worsening Severity: severe; pain level 9/10 Duration: 7 weeks Timing: acute Context: moving furniture Alleviating Factors: patient states she saw a chiropractor, a massage therapist, and did laser therapy with no relief taking gabapentin and celebrex Associated Symptoms: numbness; tingling; radiation down leg (left) Previous Surgery: none Prior Imaging: x ray; MRI Previous PT: none Notes: The patient was seen by Shearon Balo, PA-C. Had xrays, prescribed gabapentin and ordered MRI She reported a few weeks ago or so she started numbness and weakness in the foot. She reports she cannot heel walk  Past Medical History:  Diagnosis Date   Elevated antinuclear antibody (ANA) level    Endometriosis    sx 2008 (x1), 2018 (x2)   [facility] Patient infection    hx    Past Surgical History:  Procedure Laterality Date   APPENDECTOMY  10/2017   EXCISION OF ENDOMETRIOMA  10/2017   NECK SURGERY  07/2017   c5 c6   PARTIAL HYSTERECTOMY  05/2017    Family History  Problem Relation Age of Onset   Allergies Mother    Arthritis Mother    Asthma Mother    Headache Mother    Headache Father    Headache Sister    Allergies Sister    Arthritis Sister    Allergies Brother    Headache Brother    Stroke Maternal Grandmother    Stroke Paternal Grandmother    Tuberculosis Other    Thyroid disease Neg Hx    Social History:  reports that she has never smoked. She has never used smokeless tobacco. She reports current alcohol use. She reports that she does not use drugs.  Allergies:  Allergies  Allergen Reactions   Cyclobenzaprine Rash   Sulfa Antibiotics Rash   Gluten Meal Nausea Only and Other (See Comments)    Other reaction(s): GI Upset (intolerance), Lethargy (intolerance), Malaise (intolerance)    Current meds: CeleBREX 200 mg capsule dextroamphetamine-amphetamine  15 mg tablet Neurontin 300 mg capsule Omega 3 predniSONE 5 mg tablets in a dose pack  Review of Systems  Constitutional: Negative.   HENT: Negative.    Eyes: Negative.   Respiratory: Negative.    Cardiovascular: Negative.   Gastrointestinal: Negative.   Endocrine: Negative.   Genitourinary: Negative.   Musculoskeletal:  Positive for back pain and gait problem.  Neurological:  Positive for weakness and numbness.    There were no vitals taken for this visit. Physical Exam Constitutional:      Appearance: Normal appearance.  HENT:     Head: Normocephalic and atraumatic.     Right Ear: External ear normal.     Left Ear: External ear normal.     Nose: Nose normal.     Mouth/Throat:     Mouth: Mucous membranes are moist.  Eyes:     Pupils: Pupils are equal, round, and reactive to light.  Cardiovascular:     Rate and Rhythm: Normal rate.     Pulses: Normal pulses.  Pulmonary:     Effort: Pulmonary effort is normal.  Abdominal:     General: Bowel sounds are normal.  Musculoskeletal:     Cervical back: Normal range of motion.     Comments: Gait and Station: Appearance: ambulating with no assistive devices and antalgic gait.  Constitutional: General Appearance: healthy-appearing and distress (mild).  Psychiatric: Mood and Affect: active and alert.  Cardiovascular System:  Edema Right: none; Dorsalis and posterior tibial pulses 2+. Edema Left: none.  Abdomen: Inspection and Palpation: non-distended and no tenderness.  Skin: Inspection and palpation: no rash.  Lumbar Spine: Inspection: normal alignment. Bony Palpation of the Lumbar Spine: tender at lumbosacral junction.. Bony Palpation of the Right Hip: no tenderness of the greater trochanter and tenderness of the SI joint; Pelvis stable. Bony Palpation of the Left Hip: no tenderness of the greater trochanter and tenderness of the SI joint. Soft Tissue Palpation on the Right: No flank pain with percussion. Active Range of  Motion: limited flexion and extention.  Motor Strength: L1 Motor Strength on the Right: hip flexion iliopsoas 5/5. L1 Motor Strength on the Left: hip flexion iliopsoas 5/5. L2-L4 Motor Strength on the Right: knee extension quadriceps 5/5. L2-L4 Motor Strength on the Left: knee extension quadriceps 5/5. L5 Motor Strength on the Right: ankle dorsiflexion tibialis anterior 5/5 and great toe extension extensor hallucis longus 5/5. L5 Motor Strength on the Left: ankle dorsiflexion tibialis anterior 5/5 and great toe extension extensor hallucis longus 5/5. S1 Motor Strength on the Right: plantar flexion gastrocnemius 5/5. S1 Motor Strength on the Left: plantar flexion gastrocnemius 5/5.  Neurological System: Knee Reflex Right: normal (2). Knee Reflex Left: normal (2). Ankle Reflex Right: normal (2). Ankle Reflex Left: normal (2). Babinski Reflex Right: plantar reflex absent. Babinski Reflex Left: plantar reflex absent. Sensation on the Right: normal distal extremities. Sensation on the Left: normal distal extremities. Special Tests on the Right: no clonus of the ankle/knee. Special Tests on the Left: no clonus of the ankle/knee and seated straight leg raising test positive.  EHL is 4/5 is his dorsiflexion. Unable to heel walk. Altered sensation L5 dermatome  Upper extremities she is motor is 5/5 sensory exam is intact. Some discomfort forward flexion.  Skin:    General: Skin is warm and dry.  Neurological:     Mental Status: She is alert.    MRI demonstrates an extruded disc herniation L4-5 migrating caudad to the foramen. She is congenitally short pedicles. There is an L5 nerve root compression. Disc degeneration noted L5-S1. With moderate disc space narrowing.  X-rays demonstrate a mild to moderate levorotatory scoliosis  Assessment/Plan Impression:  1. Left L5 radiculopathy myotomal weakness dermatome dysesthesias secondary to extruded disc herniation L4-5 to the left and caudad compressing the L5  nerve root 2. Levorotoscoliosis 3. Hyperelasticity syndrome 4. History of cervical disc replacement with ossification of the disc space she has a pending MRI of the cervical spine through a neurosurgeon. She is neurologically intact in the upper extremities  Plan:  I discussion with the patient given its been 7 weeks duration the presence of neural tension signs and a neurologic deficit and a neural compressive lesion I feel be appropriate to proceed with a microlumbar decompression microdiscectomy L4-5 on the left.  I had an extensive discussion with the patient concerning the pathology relevant anatomy and treatment options. At this point exhausting conservative treatment and in the presence of a neurologic deficit we discussed microlumbar decompression. I discussed the risks and benefits including bleeding, infection, DVT, PE, anesthetic complications, worsening in their symptoms, improvement in their symptoms, C SF leakage, epidural fibrosis, need for future surgeries such as revision discectomy and lumbar fusion. I also indicated that this is an operation to basically decompress the nerve roots to allow recovery as opposed to fixing a herniated disc if it is encountered and that the incidence of recurrent chest disc herniation can approach 15%. Also  that nerve root recovery is variable and may not recover completely. Any ligament or bone that is contributing to compressing the nerves will be removed as well.  I discussed the operative course including overnight in the hospital. Immediate ambulation. Follow-up in 2 weeks for suture removal. 6 weeks until healing of the herniation and surgical incision followed by 6 weeks of reconditioning and strengthening of the core musculature. Also discussed the need to employ the concepts of disc pressure management and core motion following the surgery to minimize the risk of recurrent disc herniation. We will obtain preoperative clearance i if necessary and  proceed accordingly.  In the interim we discussed a Dosepak and gabapentin  A prescription for a steroid dose pack was given to reduce inflammation. 5 mg tablets to be taken over a 6 a day period of time in decreasing doses. The packaging provides guidance. In addition, a refill was given to be taken if persistent or recurrent pain is present. Maximum number of Sterapred dose packs in a year's period of time is approximately three. Any anti-inflammatory medication should be discontinued while using this dose pack but can be resumed following its completion. Side effects may include fluid retention, insomnia, and hyperactivity. If you are a diabetic monitor your blood glucose levels as steroids typically elevate those. You may need to supplement your current diabetes medication regiment accordingly. It is advised to consult with your medical physician for this.  Patient was given a prescription for gabapentin to be taken as directed. It is to be titrated for effect up to 3 times a day for neuropathic pain as needed. Starting with 1 a day for 3 days, then 2 a day for 3 days, then 3 times a day as needed. It is frequent that during the day the side effects are not tolerated and therefore taken only at night. Symptoms such as dizziness and being groggy. Occasionally there is fluid retention and weight gain associated with this medication. When the pain subsides and the medication is no longer needed it should be discontinued by slowly weaning off the medication. If taking it 3 times a day then it should be decreased to 2 times a day for a week and then 1 time a day for a week and then as needed.  No history of DVT or MRSA.  She does have a history of hyperelasticity  Also cervical spondylosis is an MRI of her cervical spine pending  Plan microdiscectomy L4-5 left  Cecilie Kicks, PA-C for Dr Tonita Cong 06/29/2022, 1:31 PM

## 2022-06-29 NOTE — H&P (View-Only) (Signed)
Isabella Singh is an 41 y.o. female.   Chief Complaint: back and left leg pain HPI: Location: left; low back Quality: worsening Severity: severe; pain level 9/10 Duration: 7 weeks Timing: acute Context: moving furniture Alleviating Factors: patient states she saw a chiropractor, a massage therapist, and did laser therapy with no relief taking gabapentin and celebrex Associated Symptoms: numbness; tingling; radiation down leg (left) Previous Surgery: none Prior Imaging: x ray; MRI Previous PT: none Notes: The patient was seen by Shearon Balo, PA-C. Had xrays, prescribed gabapentin and ordered MRI She reported a few weeks ago or so she started numbness and weakness in the foot. She reports she cannot heel walk  Past Medical History:  Diagnosis Date   Elevated antinuclear antibody (ANA) level    Endometriosis    sx 2008 (x1), 2018 (x2)   [facility] Patient infection    hx    Past Surgical History:  Procedure Laterality Date   APPENDECTOMY  10/2017   EXCISION OF ENDOMETRIOMA  10/2017   NECK SURGERY  07/2017   c5 c6   PARTIAL HYSTERECTOMY  05/2017    Family History  Problem Relation Age of Onset   Allergies Mother    Arthritis Mother    Asthma Mother    Headache Mother    Headache Father    Headache Sister    Allergies Sister    Arthritis Sister    Allergies Brother    Headache Brother    Stroke Maternal Grandmother    Stroke Paternal Grandmother    Tuberculosis Other    Thyroid disease Neg Hx    Social History:  reports that she has never smoked. She has never used smokeless tobacco. She reports current alcohol use. She reports that she does not use drugs.  Allergies:  Allergies  Allergen Reactions   Cyclobenzaprine Rash   Sulfa Antibiotics Rash   Gluten Meal Nausea Only and Other (See Comments)    Other reaction(s): GI Upset (intolerance), Lethargy (intolerance), Malaise (intolerance)    Current meds: CeleBREX 200 mg capsule dextroamphetamine-amphetamine  15 mg tablet Neurontin 300 mg capsule Omega 3 predniSONE 5 mg tablets in a dose pack  Review of Systems  Constitutional: Negative.   HENT: Negative.    Eyes: Negative.   Respiratory: Negative.    Cardiovascular: Negative.   Gastrointestinal: Negative.   Endocrine: Negative.   Genitourinary: Negative.   Musculoskeletal:  Positive for back pain and gait problem.  Neurological:  Positive for weakness and numbness.    There were no vitals taken for this visit. Physical Exam Constitutional:      Appearance: Normal appearance.  HENT:     Head: Normocephalic and atraumatic.     Right Ear: External ear normal.     Left Ear: External ear normal.     Nose: Nose normal.     Mouth/Throat:     Mouth: Mucous membranes are moist.  Eyes:     Pupils: Pupils are equal, round, and reactive to light.  Cardiovascular:     Rate and Rhythm: Normal rate.     Pulses: Normal pulses.  Pulmonary:     Effort: Pulmonary effort is normal.  Abdominal:     General: Bowel sounds are normal.  Musculoskeletal:     Cervical back: Normal range of motion.     Comments: Gait and Station: Appearance: ambulating with no assistive devices and antalgic gait.  Constitutional: General Appearance: healthy-appearing and distress (mild).  Psychiatric: Mood and Affect: active and alert.  Cardiovascular System:  Edema Right: none; Dorsalis and posterior tibial pulses 2+. Edema Left: none.  Abdomen: Inspection and Palpation: non-distended and no tenderness.  Skin: Inspection and palpation: no rash.  Lumbar Spine: Inspection: normal alignment. Bony Palpation of the Lumbar Spine: tender at lumbosacral junction.. Bony Palpation of the Right Hip: no tenderness of the greater trochanter and tenderness of the SI joint; Pelvis stable. Bony Palpation of the Left Hip: no tenderness of the greater trochanter and tenderness of the SI joint. Soft Tissue Palpation on the Right: No flank pain with percussion. Active Range of  Motion: limited flexion and extention.  Motor Strength: L1 Motor Strength on the Right: hip flexion iliopsoas 5/5. L1 Motor Strength on the Left: hip flexion iliopsoas 5/5. L2-L4 Motor Strength on the Right: knee extension quadriceps 5/5. L2-L4 Motor Strength on the Left: knee extension quadriceps 5/5. L5 Motor Strength on the Right: ankle dorsiflexion tibialis anterior 5/5 and great toe extension extensor hallucis longus 5/5. L5 Motor Strength on the Left: ankle dorsiflexion tibialis anterior 5/5 and great toe extension extensor hallucis longus 5/5. S1 Motor Strength on the Right: plantar flexion gastrocnemius 5/5. S1 Motor Strength on the Left: plantar flexion gastrocnemius 5/5.  Neurological System: Knee Reflex Right: normal (2). Knee Reflex Left: normal (2). Ankle Reflex Right: normal (2). Ankle Reflex Left: normal (2). Babinski Reflex Right: plantar reflex absent. Babinski Reflex Left: plantar reflex absent. Sensation on the Right: normal distal extremities. Sensation on the Left: normal distal extremities. Special Tests on the Right: no clonus of the ankle/knee. Special Tests on the Left: no clonus of the ankle/knee and seated straight leg raising test positive.  EHL is 4/5 is his dorsiflexion. Unable to heel walk. Altered sensation L5 dermatome  Upper extremities she is motor is 5/5 sensory exam is intact. Some discomfort forward flexion.  Skin:    General: Skin is warm and dry.  Neurological:     Mental Status: She is alert.    MRI demonstrates an extruded disc herniation L4-5 migrating caudad to the foramen. She is congenitally short pedicles. There is an L5 nerve root compression. Disc degeneration noted L5-S1. With moderate disc space narrowing.  X-rays demonstrate a mild to moderate levorotatory scoliosis  Assessment/Plan Impression:  1. Left L5 radiculopathy myotomal weakness dermatome dysesthesias secondary to extruded disc herniation L4-5 to the left and caudad compressing the L5  nerve root 2. Levorotoscoliosis 3. Hyperelasticity syndrome 4. History of cervical disc replacement with ossification of the disc space she has a pending MRI of the cervical spine through a neurosurgeon. She is neurologically intact in the upper extremities  Plan:  I discussion with the patient given its been 7 weeks duration the presence of neural tension signs and a neurologic deficit and a neural compressive lesion I feel be appropriate to proceed with a microlumbar decompression microdiscectomy L4-5 on the left.  I had an extensive discussion with the patient concerning the pathology relevant anatomy and treatment options. At this point exhausting conservative treatment and in the presence of a neurologic deficit we discussed microlumbar decompression. I discussed the risks and benefits including bleeding, infection, DVT, PE, anesthetic complications, worsening in their symptoms, improvement in their symptoms, C SF leakage, epidural fibrosis, need for future surgeries such as revision discectomy and lumbar fusion. I also indicated that this is an operation to basically decompress the nerve roots to allow recovery as opposed to fixing a herniated disc if it is encountered and that the incidence of recurrent chest disc herniation can approach 15%. Also  that nerve root recovery is variable and may not recover completely. Any ligament or bone that is contributing to compressing the nerves will be removed as well.  I discussed the operative course including overnight in the hospital. Immediate ambulation. Follow-up in 2 weeks for suture removal. 6 weeks until healing of the herniation and surgical incision followed by 6 weeks of reconditioning and strengthening of the core musculature. Also discussed the need to employ the concepts of disc pressure management and core motion following the surgery to minimize the risk of recurrent disc herniation. We will obtain preoperative clearance i if necessary and  proceed accordingly.  In the interim we discussed a Dosepak and gabapentin  A prescription for a steroid dose pack was given to reduce inflammation. 5 mg tablets to be taken over a 6 a day period of time in decreasing doses. The packaging provides guidance. In addition, a refill was given to be taken if persistent or recurrent pain is present. Maximum number of Sterapred dose packs in a year's period of time is approximately three. Any anti-inflammatory medication should be discontinued while using this dose pack but can be resumed following its completion. Side effects may include fluid retention, insomnia, and hyperactivity. If you are a diabetic monitor your blood glucose levels as steroids typically elevate those. You may need to supplement your current diabetes medication regiment accordingly. It is advised to consult with your medical physician for this.  Patient was given a prescription for gabapentin to be taken as directed. It is to be titrated for effect up to 3 times a day for neuropathic pain as needed. Starting with 1 a day for 3 days, then 2 a day for 3 days, then 3 times a day as needed. It is frequent that during the day the side effects are not tolerated and therefore taken only at night. Symptoms such as dizziness and being groggy. Occasionally there is fluid retention and weight gain associated with this medication. When the pain subsides and the medication is no longer needed it should be discontinued by slowly weaning off the medication. If taking it 3 times a day then it should be decreased to 2 times a day for a week and then 1 time a day for a week and then as needed.  No history of DVT or MRSA.  She does have a history of hyperelasticity  Also cervical spondylosis is an MRI of her cervical spine pending  Plan microdiscectomy L4-5 left  Cecilie Kicks, PA-C for Dr Tonita Cong 06/29/2022, 1:31 PM

## 2022-06-30 ENCOUNTER — Other Ambulatory Visit: Payer: Self-pay | Admitting: Physician Assistant

## 2022-06-30 NOTE — Telephone Encounter (Signed)
Last OV: 03/03/22  Next OV: none scheduled  Last filled: 05/18/2022  Quantity: 30 tablets

## 2022-07-01 ENCOUNTER — Other Ambulatory Visit: Payer: Self-pay

## 2022-07-01 ENCOUNTER — Encounter (HOSPITAL_COMMUNITY): Payer: Self-pay | Admitting: Specialist

## 2022-07-01 NOTE — Pre-Procedure Instructions (Addendum)
SDW CALL  Patient was given pre-op instructions over the phone. The opportunity was given for the patient to ask questions. No further questions asked. Patient verbalized understanding of instructions given.   PCP - [place], Randa Evens, PA-C Cardiologist - in [place], but not currently- pt reports she was told she did not need follow up unless she felt like she needed it.   PPM/ICD - denies  Chest x-ray - N/A EKG - 03/27/21- N/A unless requested by anesthesiologist on DOS per Anesthesia APP Stress Test - N/A ECHO - 2003 and 2013- pt reports normal other than MVP with regurgitation Cardiac Cath - N/A Holter Monitor- 05/29/21- pt states she wore this d/t feeling "palpitations" while also taking Adderall- pt states her dosages of Adderall were changed and she no longer has these symptoms.   Sleep Study - reports having a sleep study a long time ago that said she did not have sleep apnea  Aspirin Instructions: N/A  ERAS Protcol - ERAS order    COVID TEST- N/A   Anesthesia review:   Patient denies shortness of breath, fever, cough and chest pain over the phone call    Surgical Instructions    Your procedure is scheduled on Friday, July 28  Report to [facility] Main Entrance "A" at 7:30 A.M., then check in with the Admitting office.  Call this number if you have problems the morning of surgery:  [phone]    Remember:  Do not eat after midnight the night before your surgery  You may drink clear liquids until 7:00AM the morning of your surgery.   Clear liquids allowed are: Water, Non-Citrus Juices (without pulp), Carbonated Beverages, Clear Tea, Black Coffee ONLY (NO MILK, CREAM OR POWDERED CREAMER of any kind), and Gatorade   Take these medicines the morning of surgery with A SIP OF WATER: gabapentin, methocarbamol, oxycodone if needed, albuterol- Please bring all inhalers with you the day of surgery.    As of today, STOP taking any Aspirin (unless otherwise instructed by  your surgeon) Aleve, Naproxen, Ibuprofen, Motrin, Advil, Goody's, BC's, all herbal medications, fish oil, and all vitamins.   is not responsible for any belongings or valuables.      Contacts, glasses, hearing aids, dentures or partials may not be worn into surgery, please bring cases for these belongings   Patients discharged the day of surgery will not be allowed to drive home, and someone needs to stay with them for 24 hours.   SURGICAL WAITING ROOM VISITATION 1 visitor allowed in pre-op area with patient.  Patients having surgery or a procedure in a hospital may have two support people in the waiting room. Children under the age of 95 must have an adult with them who is not the patient. They may stay in the waiting area during the procedure and may switch out with other visitors. If the patient needs to stay at the hospital during part of their recovery, the visitor guidelines for inpatient rooms apply.  Please refer to the [facility] website for the visitor guidelines for Inpatients (after your surgery is over and you are in a regular room).     Special instructions:    Oral Hygiene is also important to reduce your risk of infection.  Remember - BRUSH YOUR TEETH THE MORNING OF SURGERY WITH YOUR REGULAR TOOTHPASTE   Day of Surgery:  Take a shower the day of or night before with antibacterial soap. Wear Clean/Comfortable clothing the morning of surgery Do not apply any deodorants/lotions.  Do not wear jewelry or makeup Do not wear lotions, powders, perfumes/colognes, or deodorant. Do not shave 48 hours prior to surgery.  Men may shave face and neck. Do not bring valuables to the hospital. Do not wear nail polish, gel polish, artificial nails, or any other type of covering on natural nails (fingers and toes) If you have artificial nails or gel coating that need to be removed by a nail salon, please have this removed prior to surgery. Artificial nails or gel coating  may interfere with anesthesia's ability to adequately monitor your vital signs.

## 2022-07-02 ENCOUNTER — Ambulatory Visit (HOSPITAL_COMMUNITY): Payer: Medicaid Other | Admitting: Anesthesiology

## 2022-07-02 ENCOUNTER — Ambulatory Visit (HOSPITAL_COMMUNITY)
Admission: RE | Admit: 2022-07-02 | Discharge: 2022-07-03 | Disposition: A | Discharge status: Home / Self Care | Payer: Medicaid Other | Attending: Specialist | Admitting: Specialist

## 2022-07-02 ENCOUNTER — Ambulatory Visit (HOSPITAL_COMMUNITY): Payer: Medicaid Other

## 2022-07-02 ENCOUNTER — Encounter (HOSPITAL_COMMUNITY)
Admission: RE | Disposition: A | Discharge status: Home / Self Care | Payer: Self-pay | Source: Home / Self Care | Attending: Specialist

## 2022-07-02 ENCOUNTER — Ambulatory Visit (HOSPITAL_BASED_OUTPATIENT_CLINIC_OR_DEPARTMENT_OTHER): Payer: Medicaid Other | Admitting: Anesthesiology

## 2022-07-02 ENCOUNTER — Other Ambulatory Visit: Payer: Self-pay

## 2022-07-02 ENCOUNTER — Encounter (HOSPITAL_COMMUNITY): Payer: Self-pay | Admitting: Specialist

## 2022-07-02 DIAGNOSIS — M419 Scoliosis, unspecified: Secondary | ICD-10-CM | POA: Insufficient documentation

## 2022-07-02 DIAGNOSIS — M5126 Other intervertebral disc displacement, lumbar region: Secondary | ICD-10-CM

## 2022-07-02 DIAGNOSIS — M5116 Intervertebral disc disorders with radiculopathy, lumbar region: Secondary | ICD-10-CM | POA: Insufficient documentation

## 2022-07-02 HISTORY — DX: Nontoxic multinodular goiter: E04.2

## 2022-07-02 HISTORY — DX: Family history of other specified conditions: Z84.89

## 2022-07-02 HISTORY — DX: Attention-deficit hyperactivity disorder, unspecified type: F90.9

## 2022-07-02 HISTORY — DX: Nonrheumatic mitral (valve) prolapse: I34.1

## 2022-07-02 HISTORY — DX: Bradycardia, unspecified: R00.1

## 2022-07-02 HISTORY — DX: Deficiency of other specified B group vitamins: E53.8

## 2022-07-02 HISTORY — DX: Hypotension, unspecified: I95.9

## 2022-07-02 HISTORY — PX: LUMBAR LAMINECTOMY/DECOMPRESSION MICRODISCECTOMY: SHX5026

## 2022-07-02 HISTORY — DX: Pneumonia, unspecified organism: J18.9

## 2022-07-02 HISTORY — DX: Unspecified asthma, uncomplicated: J45.909

## 2022-07-02 LAB — CBC
HCT: 43.9 % (ref 36.0–46.0)
Hemoglobin: 14.6 g/dL (ref 12.0–15.0)
MCH: 31.2 pg (ref 26.0–34.0)
MCHC: 33.3 g/dL (ref 30.0–36.0)
MCV: 93.8 fL (ref 80.0–100.0)
Platelets: 371 10*3/uL (ref 150–400)
RBC: 4.68 MIL/uL (ref 3.87–5.11)
RDW: 12.1 % (ref 11.5–15.5)
WBC: 7.7 10*3/uL (ref 4.0–10.5)
nRBC: 0 % (ref 0.0–0.2)

## 2022-07-02 LAB — BASIC METABOLIC PANEL
Anion gap: 9 (ref 5–15)
BUN: 11 mg/dL (ref 6–20)
CO2: 25 mmol/L (ref 22–32)
Calcium: 9.2 mg/dL (ref 8.9–10.3)
Chloride: 105 mmol/L (ref 98–111)
Creatinine, Ser: 0.72 mg/dL (ref 0.44–1.00)
GFR, Estimated: >60 mL/min (ref >60)
Glucose, Bld: 94 mg/dL (ref 70–99)
Potassium: 4.1 mmol/L (ref 3.5–5.1)
Sodium: 139 mmol/L (ref 135–145)

## 2022-07-02 LAB — SURGICAL PCR SCREEN
MRSA, PCR: NEGATIVE
Staphylococcus aureus: NEGATIVE

## 2022-07-02 SURGERY — LUMBAR LAMINECTOMY/DECOMPRESSION MICRODISCECTOMY 1 LEVEL
Anesthesia: General | Laterality: Left

## 2022-07-02 MED ORDER — PROBIOTIC 250 MG PO CAPS: ORAL_CAPSULE | Freq: Every day | ORAL | Status: DC

## 2022-07-02 MED ORDER — OXYCODONE HCL 5 MG PO TABS: 5.0000 mg | ORAL_TABLET | Freq: Once | ORAL | Status: DC | PRN

## 2022-07-02 MED ORDER — MENTHOL 3 MG MT LOZG: 1.0000 | LOZENGE | OROMUCOSAL | Status: DC | PRN

## 2022-07-02 MED ORDER — KCL IN DEXTROSE-NACL 20-5-0.45 MEQ/L-%-% IV SOLN: INTRAVENOUS | Status: DC

## 2022-07-02 MED ORDER — FENTANYL CITRATE (PF) 100 MCG/2ML IJ SOLN
INTRAMUSCULAR | Status: AC
  Filled 2022-07-02: qty 2

## 2022-07-02 MED ORDER — BUPIVACAINE-EPINEPHRINE 0.5% -1:200000 IJ SOLN
INTRAMUSCULAR | Status: DC | PRN
  Administered 2022-07-02: 3 mL

## 2022-07-02 MED ORDER — ACETAMINOPHEN 650 MG RE SUPP: 650.0000 mg | RECTAL | Status: DC | PRN

## 2022-07-02 MED ORDER — MIDAZOLAM HCL 2 MG/2ML IJ SOLN
INTRAMUSCULAR | Status: AC
  Filled 2022-07-02: qty 2

## 2022-07-02 MED ORDER — TRANEXAMIC ACID-NACL 1000-0.7 MG/100ML-% IV SOLN
INTRAVENOUS | Status: DC | PRN
  Administered 2022-07-02: 1000 mg via INTRAVENOUS

## 2022-07-02 MED ORDER — ALBUTEROL SULFATE (2.5 MG/3ML) 0.083% IN NEBU
2.5000 mg | INHALATION_SOLUTION | Freq: Four times a day (QID) | RESPIRATORY_TRACT | Status: DC | PRN
Start: 2022-07-02 — End: 2022-07-03

## 2022-07-02 MED ORDER — ALBUTEROL SULFATE HFA 108 (90 BASE) MCG/ACT IN AERS: 2.0000 | INHALATION_SPRAY | Freq: Four times a day (QID) | RESPIRATORY_TRACT | Status: DC | PRN

## 2022-07-02 MED ORDER — FENTANYL CITRATE (PF) 250 MCG/5ML IJ SOLN
INTRAMUSCULAR | Status: DC | PRN
  Administered 2022-07-02 (×2): 50 ug via INTRAVENOUS
  Administered 2022-07-02: 150 ug via INTRAVENOUS

## 2022-07-02 MED ORDER — POLYETHYLENE GLYCOL 3350 17 G PO PACK: 17.0000 g | PACK | Freq: Every day | ORAL | 0 refills | Status: DC

## 2022-07-02 MED ORDER — ONDANSETRON HCL 4 MG/2ML IJ SOLN: 4.0000 mg | Freq: Four times a day (QID) | INTRAMUSCULAR | Status: DC | PRN

## 2022-07-02 MED ORDER — PHENYLEPHRINE 80 MCG/ML (10ML) SYRINGE FOR IV PUSH (FOR BLOOD PRESSURE SUPPORT)
PREFILLED_SYRINGE | INTRAVENOUS | Status: DC | PRN
  Administered 2022-07-02 (×3): 80 ug via INTRAVENOUS

## 2022-07-02 MED ORDER — PHENYLEPHRINE 80 MCG/ML (10ML) SYRINGE FOR IV PUSH (FOR BLOOD PRESSURE SUPPORT)
PREFILLED_SYRINGE | INTRAVENOUS | Status: AC
  Filled 2022-07-02: qty 10

## 2022-07-02 MED ORDER — DOCUSATE SODIUM 100 MG PO CAPS
100.0000 mg | ORAL_CAPSULE | Freq: Two times a day (BID) | ORAL | Status: DC
Start: 2022-07-02 — End: 2022-07-03
  Administered 2022-07-02 – 2022-07-03 (×2): 100 mg via ORAL
  Filled 2022-07-02 (×2): qty 1

## 2022-07-02 MED ORDER — SUGAMMADEX SODIUM 200 MG/2ML IV SOLN
INTRAVENOUS | Status: DC | PRN
  Administered 2022-07-02: 100 mg via INTRAVENOUS

## 2022-07-02 MED ORDER — LACTATED RINGERS IV SOLN
INTRAVENOUS | Status: DC
Start: 2022-07-02 — End: 2022-07-02

## 2022-07-02 MED ORDER — THROMBIN 20000 UNITS EX SOLR
CUTANEOUS | Status: DC | PRN
  Administered 2022-07-02: 20 mL via TOPICAL

## 2022-07-02 MED ORDER — CEFAZOLIN SODIUM-DEXTROSE 2-4 GM/100ML-% IV SOLN
2.0000 g | INTRAVENOUS | Status: AC
  Administered 2022-07-02: 2 g via INTRAVENOUS
  Filled 2022-07-02: qty 100

## 2022-07-02 MED ORDER — GLYCOPYRROLATE 0.2 MG/ML IJ SOLN
INTRAMUSCULAR | Status: DC | PRN
  Administered 2022-07-02: .2 mg via INTRAVENOUS

## 2022-07-02 MED ORDER — FENTANYL CITRATE (PF) 100 MCG/2ML IJ SOLN
25.0000 ug | INTRAMUSCULAR | Status: DC | PRN
  Administered 2022-07-02 (×2): 50 ug via INTRAVENOUS

## 2022-07-02 MED ORDER — OXYCODONE HCL 5 MG PO TABS: 5.0000 mg | ORAL_TABLET | ORAL | Status: DC | PRN

## 2022-07-02 MED ORDER — CEFAZOLIN SODIUM-DEXTROSE 2-4 GM/100ML-% IV SOLN
2.0000 g | Freq: Three times a day (TID) | INTRAVENOUS | Status: DC
  Administered 2022-07-02 – 2022-07-03 (×2): 2 g via INTRAVENOUS
  Filled 2022-07-02 (×2): qty 100

## 2022-07-02 MED ORDER — ONDANSETRON HCL 4 MG/2ML IJ SOLN
INTRAMUSCULAR | Status: DC | PRN
  Administered 2022-07-02: 4 mg via INTRAVENOUS

## 2022-07-02 MED ORDER — GABAPENTIN 300 MG PO CAPS
300.0000 mg | ORAL_CAPSULE | Freq: Two times a day (BID) | ORAL | Status: DC
  Administered 2022-07-02 – 2022-07-03 (×2): 300 mg via ORAL
  Filled 2022-07-02 (×2): qty 1

## 2022-07-02 MED ORDER — EPHEDRINE SULFATE-NACL 50-0.9 MG/10ML-% IV SOSY
PREFILLED_SYRINGE | INTRAVENOUS | Status: DC | PRN
  Administered 2022-07-02: 2.5 mg via INTRAVENOUS

## 2022-07-02 MED ORDER — AMPHETAMINE-DEXTROAMPHETAMINE 15 MG PO TABS: 1.0000 | ORAL_TABLET | Freq: Every day | ORAL | Status: DC

## 2022-07-02 MED ORDER — OXYCODONE HCL 5 MG PO TABS: 5.0000 mg | ORAL_TABLET | ORAL | 0 refills | Status: AC | PRN

## 2022-07-02 MED ORDER — FENTANYL CITRATE (PF) 250 MCG/5ML IJ SOLN
INTRAMUSCULAR | Status: AC
  Filled 2022-07-02: qty 5

## 2022-07-02 MED ORDER — POLYETHYLENE GLYCOL 3350 17 G PO PACK: 17.0000 g | PACK | Freq: Every day | ORAL | Status: DC | PRN

## 2022-07-02 MED ORDER — ASPIRIN 81 MG PO TBEC: 81.0000 mg | DELAYED_RELEASE_TABLET | Freq: Every day | ORAL | 1 refills | Status: DC

## 2022-07-02 MED ORDER — 0.9 % SODIUM CHLORIDE (POUR BTL) OPTIME
TOPICAL | Status: DC | PRN
  Administered 2022-07-02: 1000 mL

## 2022-07-02 MED ORDER — LACTATED RINGERS IV SOLN: INTRAVENOUS | Status: DC

## 2022-07-02 MED ORDER — ONDANSETRON HCL 4 MG PO TABS
4.0000 mg | ORAL_TABLET | Freq: Four times a day (QID) | ORAL | Status: DC | PRN
Start: 2022-07-02 — End: 2022-07-03

## 2022-07-02 MED ORDER — DEXAMETHASONE SODIUM PHOSPHATE 10 MG/ML IJ SOLN
INTRAMUSCULAR | Status: DC | PRN
  Administered 2022-07-02: 8 mg via INTRAVENOUS

## 2022-07-02 MED ORDER — ALUM & MAG HYDROXIDE-SIMETH 200-200-20 MG/5ML PO SUSP: 30.0000 mL | Freq: Four times a day (QID) | ORAL | Status: DC | PRN

## 2022-07-02 MED ORDER — PROPOFOL 10 MG/ML IV BOLUS
INTRAVENOUS | Status: DC | PRN
  Administered 2022-07-02: 150 mg via INTRAVENOUS

## 2022-07-02 MED ORDER — OXYCODONE HCL 5 MG PO TABS
10.0000 mg | ORAL_TABLET | ORAL | Status: DC | PRN
  Administered 2022-07-02 – 2022-07-03 (×4): 10 mg via ORAL
  Filled 2022-07-02 (×4): qty 2

## 2022-07-02 MED ORDER — LIDOCAINE HCL (CARDIAC) PF 100 MG/5ML IV SOSY
PREFILLED_SYRINGE | INTRAVENOUS | Status: DC | PRN
  Administered 2022-07-02: 60 mg via INTRAVENOUS

## 2022-07-02 MED ORDER — ROCURONIUM BROMIDE 100 MG/10ML IV SOLN
INTRAVENOUS | Status: DC | PRN
  Administered 2022-07-02: 40 mg via INTRAVENOUS
  Administered 2022-07-02: 20 mg via INTRAVENOUS

## 2022-07-02 MED ORDER — OXYCODONE HCL 5 MG/5ML PO SOLN: 5.0000 mg | Freq: Once | ORAL | Status: DC | PRN

## 2022-07-02 MED ORDER — TRANEXAMIC ACID 1000 MG/10ML IV SOLN
2000.0000 mg | INTRAVENOUS | Status: DC
  Filled 2022-07-02: qty 20

## 2022-07-02 MED ORDER — BISACODYL 5 MG PO TBEC: 5.0000 mg | DELAYED_RELEASE_TABLET | Freq: Every day | ORAL | Status: DC | PRN

## 2022-07-02 MED ORDER — CHLORHEXIDINE GLUCONATE 0.12 % MT SOLN
15.0000 mL | Freq: Once | OROMUCOSAL | Status: AC
  Administered 2022-07-02: 15 mL via OROMUCOSAL
  Filled 2022-07-02: qty 15

## 2022-07-02 MED ORDER — HYDROMORPHONE HCL 1 MG/ML IJ SOLN: 0.5000 mg | INTRAMUSCULAR | Status: DC | PRN

## 2022-07-02 MED ORDER — DOCUSATE SODIUM 100 MG PO CAPS: 100.0000 mg | ORAL_CAPSULE | Freq: Two times a day (BID) | ORAL | 1 refills | Status: DC | PRN

## 2022-07-02 MED ORDER — ACETAMINOPHEN 325 MG PO TABS: 650.0000 mg | ORAL_TABLET | ORAL | Status: DC | PRN

## 2022-07-02 MED ORDER — TRANEXAMIC ACID-NACL 1000-0.7 MG/100ML-% IV SOLN
INTRAVENOUS | Status: AC
Start: 2022-07-02 — End: ?
  Filled 2022-07-02: qty 100

## 2022-07-02 MED ORDER — THROMBIN 20000 UNITS EX SOLR
CUTANEOUS | Status: AC
  Filled 2022-07-02: qty 20000

## 2022-07-02 MED ORDER — ORAL CARE MOUTH RINSE
15.0000 mL | Freq: Once | OROMUCOSAL | Status: AC
Start: 2022-07-02 — End: 2022-07-02

## 2022-07-02 MED ORDER — METHOCARBAMOL 500 MG PO TABS
500.0000 mg | ORAL_TABLET | Freq: Three times a day (TID) | ORAL | Status: DC | PRN
  Administered 2022-07-02 – 2022-07-03 (×2): 500 mg via ORAL
  Filled 2022-07-02 (×2): qty 1

## 2022-07-02 MED ORDER — PROPOFOL 10 MG/ML IV BOLUS
INTRAVENOUS | Status: AC
  Filled 2022-07-02: qty 20

## 2022-07-02 MED ORDER — ACETAMINOPHEN 10 MG/ML IV SOLN
1000.0000 mg | INTRAVENOUS | Status: AC
  Administered 2022-07-02: 1000 mg via INTRAVENOUS
  Filled 2022-07-02: qty 100

## 2022-07-02 MED ORDER — PHENOL 1.4 % MT LIQD: 1.0000 | OROMUCOSAL | Status: DC | PRN

## 2022-07-02 MED ORDER — BUPIVACAINE-EPINEPHRINE 0.5% -1:200000 IJ SOLN
INTRAMUSCULAR | Status: AC
  Filled 2022-07-02: qty 1

## 2022-07-02 SURGICAL SUPPLY — 66 items
BAG COUNTER SPONGE SURGICOUNT (BAG) ×2 IMPLANT
BAG DECANTER FOR FLEXI CONT (MISCELLANEOUS) IMPLANT
BAND RUBBER #18 3X1/16 STRL (MISCELLANEOUS) ×4 IMPLANT
BUR EGG ELITE 5.0 (BURR) IMPLANT
BUR RND DIAMOND ELITE 4.0 (BURR) ×1 IMPLANT
BUR STRYKR EGG 5.0 (BURR) IMPLANT
CARTRIDGE OIL MAESTRO DRILL (MISCELLANEOUS) IMPLANT
CLEANER TIP ELECTROSURG 2X2 (MISCELLANEOUS) ×2 IMPLANT
CNTNR URN SCR LID CUP LEK RST (MISCELLANEOUS) ×1 IMPLANT
CONT SPEC 4OZ STRL OR WHT (MISCELLANEOUS) ×1
DIFFUSER DRILL AIR PNEUMATIC (MISCELLANEOUS) ×1 IMPLANT
DRAPE LAPAROTOMY 100X72X124 (DRAPES) ×2 IMPLANT
DRAPE MICROSCOPE LEICA (MISCELLANEOUS) ×2 IMPLANT
DRAPE SHEET LG 3/4 BI-LAMINATE (DRAPES) ×2 IMPLANT
DRAPE SURG 17X11 SM STRL (DRAPES) ×2 IMPLANT
DRAPE UTILITY XL STRL (DRAPES) ×2 IMPLANT
DRSG AQUACEL AG ADV 3.5X 4 (GAUZE/BANDAGES/DRESSINGS) ×1 IMPLANT
DRSG AQUACEL AG ADV 3.5X 6 (GAUZE/BANDAGES/DRESSINGS) IMPLANT
DRSG TELFA 3X8 NADH (GAUZE/BANDAGES/DRESSINGS) IMPLANT
DURAPREP 26ML APPLICATOR (WOUND CARE) ×2 IMPLANT
DURASEAL SPINE SEALANT 3ML (MISCELLANEOUS) IMPLANT
ELECT BLADE 4.0 EZ CLEAN MEGAD (MISCELLANEOUS)
ELECT REM PT RETURN 9FT ADLT (ELECTROSURGICAL) ×2
ELECTRODE BLDE 4.0 EZ CLN MEGD (MISCELLANEOUS) IMPLANT
ELECTRODE REM PT RTRN 9FT ADLT (ELECTROSURGICAL) ×1 IMPLANT
GLOVE BIOGEL PI IND STRL 7.5 (GLOVE) ×1 IMPLANT
GLOVE BIOGEL PI INDICATOR 7.5 (GLOVE) ×1
GLOVE ECLIPSE 7.0 STRL STRAW (GLOVE) ×3 IMPLANT
GLOVE INDICATOR 7.5 STRL GRN (GLOVE) ×1 IMPLANT
GLOVE SURG SS PI 6.5 STRL IVOR (GLOVE) ×1 IMPLANT
GLOVE SURG SS PI 7.0 STRL IVOR (GLOVE) ×2 IMPLANT
GLOVE SURG SS PI 8.0 STRL IVOR (GLOVE) ×5 IMPLANT
GOWN STRL REUS W/ TWL LRG LVL3 (GOWN DISPOSABLE) ×1 IMPLANT
GOWN STRL REUS W/ TWL XL LVL3 (GOWN DISPOSABLE) ×1 IMPLANT
GOWN STRL REUS W/TWL LRG LVL3 (GOWN DISPOSABLE) ×2
GOWN STRL REUS W/TWL XL LVL3 (GOWN DISPOSABLE) ×1
IV CATH 14GX2 1/4 (CATHETERS) ×2 IMPLANT
KIT BASIN OR (CUSTOM PROCEDURE TRAY) ×2 IMPLANT
NDL SPNL 18GX3.5 QUINCKE PK (NEEDLE) ×2 IMPLANT
NEEDLE 22X1 1/2 (OR ONLY) (NEEDLE) ×2 IMPLANT
NEEDLE SPNL 18GX3.5 QUINCKE PK (NEEDLE) ×4 IMPLANT
OIL CARTRIDGE MAESTRO DRILL (MISCELLANEOUS) ×2
PACK LAMINECTOMY NEURO (CUSTOM PROCEDURE TRAY) ×2 IMPLANT
PAD DRESSING TELFA 3X8 NADH (GAUZE/BANDAGES/DRESSINGS) IMPLANT
PATTIES SURGICAL .25X.25 (GAUZE/BANDAGES/DRESSINGS) ×1 IMPLANT
PATTIES SURGICAL .75X.75 (GAUZE/BANDAGES/DRESSINGS) ×2 IMPLANT
SOLUTION PRONTOSAN WOUND 350ML (IRRIGATION / IRRIGATOR) IMPLANT
SPONGE SURGIFOAM ABS GEL 100 (HEMOSTASIS) ×2 IMPLANT
SPONGE T-LAP 4X18 ~~LOC~~+RFID (SPONGE) IMPLANT
STAPLER VISISTAT (STAPLE) IMPLANT
STRIP CLOSURE SKIN 1/2X4 (GAUZE/BANDAGES/DRESSINGS) ×2 IMPLANT
SUT NURALON 4 0 TR CR/8 (SUTURE) IMPLANT
SUT PROLENE 3 0 PS 2 (SUTURE) ×1 IMPLANT
SUT VIC AB 1 CT1 27 (SUTURE)
SUT VIC AB 1 CT1 27XBRD ANTBC (SUTURE) IMPLANT
SUT VIC AB 1-0 CT2 27 (SUTURE) ×1 IMPLANT
SUT VIC AB 2-0 CT1 27 (SUTURE) ×1
SUT VIC AB 2-0 CT1 TAPERPNT 27 (SUTURE) IMPLANT
SUT VIC AB 2-0 CT2 27 (SUTURE) IMPLANT
SYR 3ML LL SCALE MARK (SYRINGE) ×2 IMPLANT
TOWEL GREEN STERILE (TOWEL DISPOSABLE) ×2 IMPLANT
TOWEL GREEN STERILE FF (TOWEL DISPOSABLE) ×2 IMPLANT
TRAY FOLEY MTR SLVR 14FR STAT (SET/KITS/TRAYS/PACK) ×1 IMPLANT
TRAY FOLEY MTR SLVR 16FR STAT (SET/KITS/TRAYS/PACK) ×1 IMPLANT
WIPE CHG 2% PREP (PERSONAL CARE ITEMS) ×2 IMPLANT
YANKAUER SUCT BULB TIP NO VENT (SUCTIONS) ×2 IMPLANT

## 2022-07-02 NOTE — Op Note (Unsigned)
Isabella Singh, Isabella Singh MEDICAL RECORD NO: [id number] ACCOUNT NO: [id number] DATE OF BIRTH: 12-23-1980 FACILITY: [facility] LOCATION: [facility] PHYSICIAN: Johnn Hai, MD  Operative Report   DATE OF PROCEDURE: 07/02/2022  PREOPERATIVE DIAGNOSIS:  Herniated nucleus pulposus L4-L5 left with extruded fragment.  POSTOPERATIVE DIAGNOSIS:  Herniated nucleus pulposus L4-L5 left with extruded fragment, extensive epidural venous plexus.  PROCEDURE PERFORMED:   1.  Microdiskectomy L5-S1 left. 2.  Foraminotomy L5, L4. 3.  Microdiskectomy L4-L5 with retrieval of free fragment. 4.  Lysis of extensive epidural venous plexus.  ANESTHESIA:  General.  ASSISTANT:  Lacie Draft, PA  HISTORY:  A 41 year old with left lower extremity radicular pain, L5 nerve root distribution secondary to herniated disk, extruded fragment compressing the L5 root, migrating caudad to beneath the lamina of L5.  She had an underlying scoliosis, neural  tension signs.  Failing conservative treatment, indicated for microlumbar decompression.  Risks and benefits discussed including bleeding, infection, damage to neurovascular structures, no change in symptoms, worsening symptoms, DVT, PE, anesthetic  complications, etc.  TECHNIQUE:  The patient in supine position.  After induction of adequate general anesthesia and 2 grams of Kefzol, placed prone on the Wilson frame.  All bony prominences were well padded.  Lumbar region was prepped and draped in the usual sterile  fashion.  Two 18-gauge spinal needles utilized to localize the L4-L5 interspace, confirmed with x-ray.  Incision was made from the spinous process L4 to L5.  Subcutaneous tissue was dissected.  Electrocautery was utilized to achieve hemostasis.  Dorsal  lumbar fascia divided in line with skin incision.  Paraspinous muscle elevated from lamina of L4-L5.  Operating microscope was draped and brought in the surgical field.  Noted was the patient had hypertrophic ligamentum  flavum, underlying scoliosis.  A  lamina vertically oriented at L5.  I used a curette to detach ligamentum flavum from the cephalad edge of L5, caudad edge of L4.  Hemilaminotomy of the caudad edge of L4 was performed with 3 mm Kerrison.  I used a high-speed bur to remove approximately  20% of the medial aspect of the inferior articulating process of L4.  This exposed the superior articulating process of L5.  I then used a Penfield and a micro curette to detach ligamentum flavum from the superior articulating process and the cephalad  portion of L4.  I then used a Penfield to protect the neural elements. I performed a generous foraminotomy of L5 with a 2 mm Kerrison.  I gently mobilized soft tissue and the epidural fat medially.  I then decompressed the lateral recess to the medial  border of the pedicle.  Hypertrophic facet was noted.  Following this, there was extensive epidural venous plexus laterally extending from the lateral aspect of the thecal sac to the disk space.  Above this venous plexus was the disk space. With  herniation noted at the disk space, I performed an annulotomy.  Copious portion of disk material was removed from the disk space with a micropituitary, further lavaged with catheter lavage and additional fragments were retrieved.  The vascular leash was  cauterized and divided and gently mobilized medially.  Then, beneath the thecal sac caudal to the disk space at approximately the level of the pedicle of L5 was the extruded fragment.  This was mobilized with a Woodson and a nerve hook.  A large fragment  was retrieved followed by second fragment.  There was venous bleeding noted here beneath the thecal sac and at the shoulder of the  root.  I used electrocautery and then packed it with Gelfoam and neural patties.  I requested a gram IV of tranexamic  acid.  Following adequate time for hemostasis, the patties were gently removed.  Again, venous plexus was encountered laterally.  This was  cauterized.  Very small piece of thrombin-soaked Gelfoam 2 x 2 was placed just lateral to the shoulder of the root  at the bed of the venous plexus.  Woodson probe passed freely out the foramen of L5 and L4. No residual disk herniation was noted.  There was good mobility of the thecal sac.  No evidence of active CSF leakage or active bleeding.  I had requested topical  TXA; however, that was not available.  Following this, confirmatory radiograph obtained with a Penfield in the disk space at L4-L5.  Copiously irrigated with irrigation.  No active bleeding or CSF leakage.  I removed McCulloch retractor, irrigated the  paraspinous musculature.  No active bleeding.  I closed the dorsal lumbar fascia with 1 Vicryl interrupted figure-of-eight sutures, subcutaneous with 2-0 and skin with Prolene.  Prior to closure, there was 1 cm of excursion of the L5 root medial to  pedicle without tension.  Sterile dressing applied, placed supine on the hospital bed and transported to the recovery room in satisfactory condition.  The patient tolerated the procedure well.  No complications.  Assistance of [place], [place], was used throughout the case for patient positioning, intermittent neural retraction and closure.   VAI D: 07/02/2022 1:09:18 pm T: 07/02/2022 11:39:00 pm  JOB: [id number]/ [id number]

## 2022-07-02 NOTE — Interval H&P Note (Signed)
History and Physical Interval Note:  07/02/2022 10:08 AM  Isabella Singh  has presented today for surgery, with the diagnosis of Herniated disc L4-5 left.  The various methods of treatment have been discussed with the patient and family. After consideration of risks, benefits and other options for treatment, the patient has consented to  Procedure(s) with comments: Microdiscectomy L4-5 Left (Left) - 90 mins 3 C-Bed as a surgical intervention.  The patient's history has been reviewed, patient examined, no change in status, stable for surgery.  I have reviewed the patient's chart and labs.  Questions were answered to the patient's satisfaction.     Johnn Hai

## 2022-07-02 NOTE — Brief Op Note (Signed)
07/02/2022  10:14 AM  PATIENT:  Isabella Singh  41 y.o. female  PRE-OPERATIVE DIAGNOSIS:  Herniated disc L4-5 left  POST-OPERATIVE DIAGNOSIS:  * No post-op diagnosis entered *  PROCEDURE:  Procedure(s) with comments: Microdiscectomy L4-5 Left (Left) - 90 mins 3 C-Bed  SURGEON:  Surgeon(s) and Role:    Susa Day, MD - Primary  PHYSICIAN ASSISTANT:   ASSISTANTS: Bissell   ANESTHESIA:   general  EBL:  25   BLOOD ADMINISTERED:none  DRAINS: none   LOCAL MEDICATIONS USED:  MARCAINE     SPECIMEN:  No Specimen  DISPOSITION OF SPECIMEN:  N/A  COUNTS:  YES  TOURNIQUET:  * No tourniquets in log *  DICTATION: .Other Dictation: Dictation Number [id number]  PLAN OF CARE: Admit for overnight observation  PATIENT DISPOSITION:  PACU - hemodynamically stable.   Delay start of Pharmacological VTE agent (>24hrs) due to surgical blood loss or risk of bleeding: yes

## 2022-07-02 NOTE — Anesthesia Procedure Notes (Signed)
Procedure Name: Intubation Date/Time: 07/02/2022 10:24 AM  Performed by: Mariea Clonts, CRNAPre-anesthesia Checklist: Patient identified, Emergency Drugs available, Suction available and Patient being monitored Patient Re-evaluated:Patient Re-evaluated prior to induction Oxygen Delivery Method: Circle System Utilized Preoxygenation: Pre-oxygenation with 100% oxygen Induction Type: IV induction Ventilation: Mask ventilation without difficulty Laryngoscope Size: Miller and 2 Tube type: Oral Tube size: 7.0 mm Number of attempts: 1 Airway Equipment and Method: Stylet and Oral airway Placement Confirmation: ETT inserted through vocal cords under direct vision, positive ETCO2 and breath sounds checked- equal and bilateral Tube secured with: Tape Dental Injury: Teeth and Oropharynx as per pre-operative assessment

## 2022-07-02 NOTE — Discharge Instructions (Signed)

## 2022-07-02 NOTE — Transfer of Care (Signed)
Immediate Anesthesia Transfer of Care Note  Patient: Isabella Singh  Procedure(s) Performed: Microdiscectomy Lumbar four-five Left (Left)  Patient Location: PACU  Anesthesia Type:General  Level of Consciousness: awake, alert  and oriented  Airway & Oxygen Therapy: Patient Spontanous Breathing and Patient connected to nasal cannula oxygen  Post-op Assessment: Report given to RN and Post -op Vital signs reviewed and stable  Post vital signs: Reviewed and stable  Last Vitals:  Vitals Value Taken Time  BP 111/72 07/02/22 1315  Temp    Pulse 84 07/02/22 1316  Resp    SpO2 100 % 07/02/22 1316    Last Pain:  Vitals:   07/02/22 0839  TempSrc:   PainSc: 4       Patients Stated Pain Goal: 2 (88/28/00 3491)  Complications: No notable events documented.

## 2022-07-02 NOTE — Anesthesia Postprocedure Evaluation (Signed)
Anesthesia Post Note  Patient: Isabella Singh  Procedure(s) Performed: Microdiscectomy Lumbar four-five Left (Left)     Patient location during evaluation: PACU Anesthesia Type: General Level of consciousness: awake and alert Pain management: pain level controlled Vital Signs Assessment: post-procedure vital signs reviewed and stable Respiratory status: spontaneous breathing, nonlabored ventilation, respiratory function stable and patient connected to nasal cannula oxygen Cardiovascular status: blood pressure returned to baseline and stable Postop Assessment: no apparent nausea or vomiting Anesthetic complications: no   No notable events documented.  Last Vitals:  Vitals:   07/02/22 1421 07/02/22 1449  BP: 111/70 (!) 96/51  Pulse:  62  Resp: 12 16  Temp: (!) 36.1 C (!) 36.4 C  SpO2:  100%    Last Pain:  Vitals:   07/02/22 1449  TempSrc: Oral  PainSc: 2    Pain Goal: Patients Stated Pain Goal: 2 (07/02/22 0839)  LLE Motor Response: Purposeful movement (07/02/22 1500) LLE Sensation: Full sensation (07/02/22 1500) RLE Motor Response: Purposeful movement (07/02/22 1500) RLE Sensation: Full sensation (07/02/22 1500)        Naryiah Schley

## 2022-07-02 NOTE — Anesthesia Preprocedure Evaluation (Signed)
Anesthesia Evaluation  Patient identified by MRN, date of birth, ID band Patient awake    Reviewed: Allergy & Precautions, H&P , NPO status , Patient's Chart, lab work & pertinent test results  Airway Mallampati: II   Neck ROM: full    Dental   Pulmonary asthma ,    breath sounds clear to auscultation       Cardiovascular + Valvular Problems/Murmurs MVP  Rhythm:regular Rate:Normal     Neuro/Psych HNP L4/5    GI/Hepatic negative GI ROS,   Endo/Other    Renal/GU      Musculoskeletal   Abdominal   Peds  Hematology   Anesthesia Other Findings   Reproductive/Obstetrics                             Anesthesia Physical Anesthesia Plan  ASA: 2  Anesthesia Plan: General   Post-op Pain Management:    Induction: Intravenous  PONV Risk Score and Plan: 3 and Ondansetron, Dexamethasone, Midazolam and Treatment may vary due to age or medical condition  Airway Management Planned: Oral ETT  Additional Equipment:   Intra-op Plan:   Post-operative Plan: Extubation in OR  Informed Consent: I have reviewed the patients History and Physical, chart, labs and discussed the procedure including the risks, benefits and alternatives for the proposed anesthesia with the patient or authorized representative who has indicated his/her understanding and acceptance.     Dental advisory given  Plan Discussed with: CRNA, Anesthesiologist and Surgeon  Anesthesia Plan Comments:         Anesthesia Quick Evaluation

## 2022-07-03 ENCOUNTER — Encounter (HOSPITAL_COMMUNITY): Payer: Self-pay | Admitting: Specialist

## 2022-07-03 DIAGNOSIS — M5116 Intervertebral disc disorders with radiculopathy, lumbar region: Secondary | ICD-10-CM | POA: Diagnosis not present

## 2022-07-03 NOTE — Progress Notes (Signed)
PT Cancellation Note  Patient Details Name: Isabella Singh MRN: [id number] DOB: [birth year]   Cancelled Treatment:    Reason Eval/Treat Not Completed: PT screened, no needs identified, will sign off Per OT, patient with no skilled PT needs identified acutely. All education completed by OT. PT will sign off.   Mashanda Ishibashi A. Gilford Rile PT, DPT Acute Rehabilitation Services Office [phone]    Isabella Singh 07/03/2022, 9:13 AM

## 2022-07-03 NOTE — Plan of Care (Signed)

## 2022-07-03 NOTE — Progress Notes (Signed)
Patient ambulated out of the unit with her mother accompanying her for discharge home; moves all extremities well; incision on her lower back with Aquacel dressing on and is clean, dry and intact; in no acute distress nor complaints of pain nor discomfort; room was checked for all her belongings and her mother took it along with her; discharge instructions concerning her medications, follow up appointment, incision care and when to call the doctor as needed were all discussed with patient and her mother by RN and both expressed understanding on the instructions given.

## 2022-07-03 NOTE — Progress Notes (Signed)
Subjective: 1 Day Post-Op Procedure(s) (LRB): Microdiscectomy Lumbar four-five Left (Left)  Patient reports pain as mild. She had no issues overnight and feels comfortable.   Objective:   VITALS:  Temp:  [97 F (36.1 C)-98.5 F (36.9 C)] 98.2 F (36.8 C) (07/29 0235) Pulse Rate:  [61-84] 66 (07/29 0235) Resp:  [11-18] 18 (07/29 0235) BP: (94-111)/(50-77) 94/60 (07/29 0235) SpO2:  [98 %-100 %] 98 % (07/29 0235)  Bilateral Lower Extremity: Incision dressings c/d/i No TTP HF/KE/KF/ADF/APF/EHL 5/5 SILT throughout DP, PT 2+ to palp CR < 2s   LABS Recent Labs    07/02/22 0835  HGB 14.6  WBC 7.7  PLT 371   Recent Labs    07/02/22 0835  NA 139  K 4.1  CL 105  CO2 25  BUN 11  CREATININE 0.72  GLUCOSE 94   No results for input(s): "LABPT", "INR" in the last 72 hours.   Assessment/Plan: 1 Day Post-Op Procedure(s) (LRB): Microdiscectomy Lumbar four-five Left (Left)  Advance diet Up with therapy D/C IV fluids Plan for discharge today  Armond Hang 07/03/2022, 10:36 AM

## 2022-07-03 NOTE — Evaluation (Signed)
Occupational Therapy Evaluation Patient Details Name: Isabella Singh MRN: [id number] DOB: 03/17/1981 Today's Date: 07/03/2022   History of Present Illness Isabella Singh  41 y.o. female who underwent microdiscectomy Lumbar four-five Left on 7/28. PMHx: elevated ANA, endometriosis, epstein barr infection   Clinical Impression   Isabella Singh was evaluated s/p the above admission list, she is generally indep at baseline despite LLE pain and some L foot dorsiflexion weakness endorsed prior to surgery. Pt was educated on compensatory techniques fro ADLs and all back precautions/restrictions. Overall she demonstrated mod I ability to complete ADLs and functional mobility within the room, no DME needed. Recommend d/c to home with assist from family initially.    Recommendations for follow up therapy are one component of a multi-disciplinary discharge planning process, led by the attending physician.  Recommendations may be updated based on patient status, additional functional criteria and insurance authorization.   Follow Up Recommendations  No OT follow up    Assistance Recommended at Discharge Intermittent Supervision/Assistance  Patient can return home with the following A little help with walking and/or transfers;A little help with bathing/dressing/bathroom;Assist for transportation;Assistance with cooking/housework    Functional Status Assessment  Patient has had a recent decline in their functional status and demonstrates the ability to make significant improvements in function in a reasonable and predictable amount of time.  Equipment Recommendations  None recommended by OT       Precautions / Restrictions Precautions Precautions: Back;Fall Precaution Booklet Issued: Yes (comment) Restrictions Weight Bearing Restrictions: No      Mobility Bed Mobility Overal bed mobility: Needs Assistance Bed Mobility: Rolling, Sidelying to Sit Rolling: Modified independent (Device/Increase  time) Sidelying to sit: Modified independent (Device/Increase time)       General bed mobility comments: from flat bed with no rails    Transfers Overall transfer level: Needs assistance Equipment used: None Transfers: Sit to/from Stand Sit to Stand: Supervision                  Balance Overall balance assessment: Needs assistance Sitting-balance support: Feet supported Sitting balance-Leahy Scale: Good     Standing balance support: No upper extremity supported, During functional activity Standing balance-Leahy Scale: Fair             ADL either performed or assessed with clinical judgement   ADL Overall ADL's : Needs assistance/impaired             Functional mobility during ADLs: Supervision/safety General ADL Comments: pt demonstrated great understanding of compensatroy techniques for ADLs, supervision provided throughout for safety only. no DME needed     Vision Baseline Vision/History: 0 No visual deficits Vision Assessment?: No apparent visual deficits     Perception     Praxis      Pertinent Vitals/Pain Pain Assessment Pain Assessment: Faces Faces Pain Scale: Hurts little more Pain Location: back Pain Descriptors / Indicators: Discomfort Pain Intervention(s): Limited activity within patient's tolerance, Monitored during session     Hand Dominance Right   Extremity/Trunk Assessment Upper Extremity Assessment Upper Extremity Assessment: Overall WFL for tasks assessed   Lower Extremity Assessment Lower Extremity Assessment: Generalized weakness   Cervical / Trunk Assessment Cervical / Trunk Assessment: Back Surgery   Communication Communication Communication: No difficulties   Cognition Arousal/Alertness: Awake/alert Behavior During Therapy: WFL for tasks assessed/performed Overall Cognitive Status: Within Functional Limits for tasks assessed                   General Comments  VSS  onRA     Home Living Family/patient  expects to be discharged to:: Private residence Living Arrangements: Spouse/significant other;Children Available Help at Discharge: Family;Available 24 hours/day Type of Home: House             Bathroom Shower/Tub: Walk-in Psychologist, prison and probation services: Standard         Additional Comments: pt is considering staying at her moms house initially wtih 24/7 assist and 1 level home. 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        OT Problem List: Impaired balance (sitting and/or standing);Decreased knowledge of precautions;Pain      OT Treatment/Interventions:      OT Goals(Current goals can be found in the care plan section) Acute Rehab OT Goals Patient Stated Goal: home OT Goal Formulation: With patient Time For Goal Achievement: 07/03/22 Potential to Achieve Goals: Good   AM-PAC OT "6 Clicks" Daily Activity     Outcome Measure Help from another person eating meals?: None Help from another person taking care of personal grooming?: A Little Help from another person toileting, which includes using toliet, bedpan, or urinal?: A Little Help from another person bathing (including washing, rinsing, drying)?: A Little Help from another person to put on and taking off regular upper body clothing?: None Help from another person to put on and taking off regular lower body clothing?: A Little 6 Click Score: 20   End of Session Nurse Communication: Mobility status  Activity Tolerance: Patient tolerated treatment well Patient left: in bed;with call bell/phone within reach;with nursing/sitter in room  OT Visit Diagnosis: Unsteadiness on feet (R26.81);Pain                Time: 4784-1282 OT Time Calculation (min): 20 min Charges:  OT General Charges $OT Visit: 1 Visit OT Evaluation $OT Eval Moderate  Complexity: 1 Mod    Christee Mervine A Aveah Castell 07/03/2022, 9:24 AM

## 2022-07-26 ENCOUNTER — Ambulatory Visit: Payer: Medicaid Other | Attending: Neurological Surgery | Admitting: Physical Therapy

## 2022-08-18 ENCOUNTER — Other Ambulatory Visit: Payer: Self-pay | Admitting: Physician Assistant

## 2022-08-18 NOTE — Telephone Encounter (Signed)
Last OV: 03/03/22  Next OV: none scheduled  Last filled: 07/01/22  Quantity: 30 tablets

## 2022-08-25 DIAGNOSIS — M5451 Vertebrogenic low back pain: Secondary | ICD-10-CM | POA: Diagnosis not present

## 2022-08-25 DIAGNOSIS — M542 Cervicalgia: Secondary | ICD-10-CM | POA: Diagnosis not present

## 2022-08-26 ENCOUNTER — Ambulatory Visit: Payer: BC Managed Care – PPO | Admitting: Internal Medicine

## 2022-08-26 ENCOUNTER — Encounter: Payer: Self-pay | Admitting: Internal Medicine

## 2022-08-26 VITALS — BP 110/70 | HR 82 | Ht 66.0 in | Wt 147.0 lb

## 2022-08-26 DIAGNOSIS — M26602 Left temporomandibular joint disorder, unspecified: Secondary | ICD-10-CM | POA: Diagnosis not present

## 2022-08-26 DIAGNOSIS — E200 Idiopathic hypoparathyroidism: Secondary | ICD-10-CM | POA: Diagnosis not present

## 2022-08-26 DIAGNOSIS — M9902 Segmental and somatic dysfunction of thoracic region: Secondary | ICD-10-CM | POA: Diagnosis not present

## 2022-08-26 DIAGNOSIS — M9901 Segmental and somatic dysfunction of cervical region: Secondary | ICD-10-CM | POA: Diagnosis not present

## 2022-08-26 DIAGNOSIS — M5412 Radiculopathy, cervical region: Secondary | ICD-10-CM | POA: Diagnosis not present

## 2022-08-26 DIAGNOSIS — E042 Nontoxic multinodular goiter: Secondary | ICD-10-CM | POA: Diagnosis not present

## 2022-08-26 DIAGNOSIS — M9903 Segmental and somatic dysfunction of lumbar region: Secondary | ICD-10-CM | POA: Diagnosis not present

## 2022-08-26 NOTE — Progress Notes (Signed)
Name: Isabella Singh  MRN/ DOB: [id number], [birth year]    Age/ Sex: 41 y.o., female    PCP: Allwardt, Randa Evens, PA-C   Reason for Endocrinology Evaluation: Multinodular goiter     Date of Initial Endocrinology Evaluation: 05/15/2019    HPI: Ms. Isabella Singh is a 41 y.o. female with a past medical history of thyroid goiter. The patient presented for initial endocrinology clinic visit on 05/15/2019 for consultative assistance with her multinodular goiter.    She was diagnosed with enlarged thyroid on CT scan in 2019.  And was diagnosed with subacute thyroiditis while living in [place].  She has no prior history of thyroid surgery no radiation exposure. She has been diagnosed with post viral  She has had serial ultrasound from 2020 until 2022 showing subcentimeter thyroid nodules, that did not meet criteria for FNA nor serial monitoring.   Sister and mother with thyroid disease     SUBJECTIVE:    Today (08/26/22): Ms. Menard is here for follow-up of multinodular goiter, history of low PTH.   The patient does follow-up with a functional doctor who has recently done her lab results. (TFTs below) She is done online tracker which stated that she may have a pituitary issue, her sister has a pituitary and thyroid disease  The patient does have left neck pain, some mandibular.  Of note the patient has had cervical spine surgery with a left anterior neck incision  Weight stable  Has chronic occasional  palpitations  Denies tremors  Has loose stool or  diarrhea   She is on MVI  She is  on Vitamin D 5000 iu twice weekly      HISTORY:  Past Medical History:  Past Medical History:  Diagnosis Date   ADHD (attention deficit hyperactivity disorder)    Asthma    B12 deficiency    "borderline"- gets B12 injections   Bradycardia    after surgery in 2011 at night when sleeping   Elevated antinuclear antibody (ANA) level    Endometriosis    sx 2008 (x1), 2018 (x2)   Epstein Barr  infection    hx   Family history of adverse reaction to anesthesia    sisters "wake up too early"   Low blood pressure    exercise and stress induced hypotension   Mitral valve prolapse    with regurgitation, last looked at in 2013   Multinodular goiter (nontoxic)    post viral thyroiditis from [facility] Virus   Pneumonia    walkign PNA in 2007   Past Surgical History:  Past Surgical History:  Procedure Laterality Date   APPENDECTOMY  10/2017   ENDOMETRIAL ABLATION     with fallopian tube cystectomy 2008   EXCISION OF ENDOMETRIOMA  10/2017   LUMBAR LAMINECTOMY/DECOMPRESSION MICRODISCECTOMY Left 07/02/2022   Procedure: Microdiscectomy Lumbar four-five Left;  Surgeon: Susa Day, MD;  Location: [facility];  Service: Orthopedics;  Laterality: Left;   NECK SURGERY  07/2017   c5 c6   PARTIAL HYSTERECTOMY  05/2017    Social History:  reports that she has never smoked. She has never used smokeless tobacco. She reports that she does not currently use alcohol. She reports that she does not use drugs. Family History: family history includes Allergies in her brother, mother, and sister; Arthritis in her mother and sister; Asthma in her mother; Headache in her brother, father, mother, and sister; Stroke in her maternal grandmother and paternal grandmother; Tuberculosis in an other family member.  HOME MEDICATIONS: Allergies as of 08/26/2022       Reactions   Cyclobenzaprine Rash   Sulfa Antibiotics Rash   Egg White (egg Protein)    Stomach pain   Gluten Meal Nausea Only, Other (See Comments)   GI Upset (intolerance), Lethargy (intolerance), Malaise (intolerance)        Medication List        Accurate as of August 26, 2022 10:35 AM. If you have any questions, ask your nurse or doctor.          STOP taking these medications    aspirin EC 81 MG tablet Stopped by: Dorita Sciara, MD   docusate sodium 100 MG capsule Commonly known as: Colace Stopped by: Dorita Sciara, MD   gabapentin 300 MG capsule Commonly known as: NEURONTIN Stopped by: Dorita Sciara, MD   methocarbamol 500 MG tablet Commonly known as: ROBAXIN Stopped by: Dorita Sciara, MD   polyethylene glycol 17 g packet Commonly known as: MIRALAX / GLYCOLAX Stopped by: Dorita Sciara, MD       TAKE these medications    albuterol 108 (90 Base) MCG/ACT inhaler Commonly known as: VENTOLIN HFA Inhale 2 puffs into the lungs every 6 (six) hours as needed for wheezing or shortness of breath.   albuterol (2.5 MG/3ML) 0.083% nebulizer solution Commonly known as: PROVENTIL Take 3 mLs (2.5 mg total) by nebulization every 6 (six) hours as needed for wheezing or shortness of breath.   amphetamine-dextroamphetamine 15 MG tablet Commonly known as: ADDERALL TAKE 1 TABLET BY MOUTH EVERY DAY   B-12 PO Take 1 capsule by mouth daily.   celecoxib 200 MG capsule Commonly known as: CELEBREX Take 200 mg by mouth daily.   PROBIOTIC PO Take 1 capsule by mouth daily.          REVIEW OF SYSTEMS: A comprehensive ROS was conducted with the patient and is negative except as per HPI     OBJECTIVE:  VS: BP 110/70 (BP Location: Left Arm, Patient Position: Sitting, Cuff Size: Small)   Pulse 82   Ht '5\' 6"'$  (1.676 m)   Wt 147 lb (66.7 kg)   SpO2 99%   BMI 23.73 kg/m    Wt Readings from Last 3 Encounters:  08/26/22 147 lb (66.7 kg)  07/02/22 144 lb (65.3 kg)  05/19/22 140 lb (63.5 kg)     EXAM: General: Pt appears well and is in NAD  Eyes: External eye exam normal without stare, lid lag or exophthalmos.  EOM intact.  PERRL.  Neck: General: Supple without adenopathy. Thyroid: Thyroid size normal.  No goiter or nodules appreciated. No thyroid bruit.  Lungs: Clear with good BS bilat with no rales, rhonchi, or wheezes  Heart: Auscultation: RRR.  Abdomen: Normoactive bowel sounds, soft, nontender, without masses or organomegaly palpable  Extremities:  BL LE: No  pretibial edema normal ROM and strength.  Mental Status: Judgment, insight: Intact Orientation: Oriented to time, place, and person Mood and affect: No depression, anxiety, or agitation     DATA REVIEWED:  Latest Reference Range & Units 08/26/22 11:07  Calcium 8.6 - 10.2 mg/dL 10.0    Latest Reference Range & Units 08/26/22 11:07  PTH, Intact 16 - 77 pg/mL 17      08/06/2022 TSH 0.704 FT4 0.98  Reverse T3  13.3  FT3 3.3  Vitamin D 32.6      Thyroid ultrasound 08/28/2021   Enlarged multinodular thyroid gland. A number of subcentimeter nodules are  again identified, which remain essentially stable and do not meet criteria for further dedicated follow-up or biopsy.     Old records , labs and images have been reviewed.    ASSESSMENT/PLAN/RECOMMENDATIONS:   Multinodular goiter:   -Patient does have left-sided neck pain but I do believe this is more related to scarring versus submandibular saliva glands -Thyroid exam is normal today -Serial ultrasounds did not reveal any concerning cysts or nodules and does NOT meet criteria for any further follow-up -I have also assured the patient that her TFTs are normal and there is no evidence of pituitary disease -I also explained to the patient that her TFTs are going to fluctuate and this is normal and does not mean there is a thyroid pathology  2.Hx low PTH:  -PTH and serum calcium are normal -Suspect assay interference in the past    3.  Positive ANA:  -Suspect this is the main reason for nonspecific symptoms   No need for any further endocrinology follow-up      Signed electronically by: Mack Guise, MD  [facility]  [facility] [address] [place], [place] [address] Phone: [phone] FAX: [phone]   CC: Allwardt, Randa Evens, PA-C [place] [address] Phone: [phone] Fax: [phone]   Return to Endocrinology clinic as  below: No future appointments.

## 2022-08-27 DIAGNOSIS — M5451 Vertebrogenic low back pain: Secondary | ICD-10-CM | POA: Diagnosis not present

## 2022-08-27 DIAGNOSIS — M542 Cervicalgia: Secondary | ICD-10-CM | POA: Diagnosis not present

## 2022-08-27 LAB — PTH, INTACT AND CALCIUM
Calcium: 10 mg/dL (ref 8.6–10.2)
PTH: 17 pg/mL (ref 16–77)

## 2022-08-30 ENCOUNTER — Encounter: Payer: Self-pay | Admitting: *Deleted

## 2022-08-30 DIAGNOSIS — M5451 Vertebrogenic low back pain: Secondary | ICD-10-CM | POA: Diagnosis not present

## 2022-08-30 DIAGNOSIS — M542 Cervicalgia: Secondary | ICD-10-CM | POA: Diagnosis not present

## 2022-09-13 DIAGNOSIS — M5451 Vertebrogenic low back pain: Secondary | ICD-10-CM | POA: Diagnosis not present

## 2022-09-24 ENCOUNTER — Telehealth: Payer: Self-pay | Admitting: Physician Assistant

## 2022-09-24 NOTE — Telephone Encounter (Signed)
Last OV: 03/03/22 VV  Next OV: none  Last filled: 08/18/2022  Quantity: 30

## 2022-09-29 NOTE — Telephone Encounter (Signed)
Pt scheduled a vv visit for 10/31 through my chart.  Is vv visit acceptable or should it be in person?     If vv is allowed, patient states she needs medication to tide her over.  If vv not allowed, please route back to admin team for follow up.    Please advise.

## 2022-09-29 NOTE — Telephone Encounter (Signed)
Alyssa, I am not sure if you got this message. Pt is scheduled to see you virtually on 10/31. If okay, pt will need a refill prior to appt for Adderall.

## 2022-09-30 NOTE — Telephone Encounter (Signed)
Pt called back told her per Alyssa, She will need to be seen in-person as it has been over a year in office and I need BP readings while she is taking this medication. She will need to wait for visit for refill, thanks. Pt verbalized understanding.

## 2022-09-30 NOTE — Telephone Encounter (Signed)
Left message on voicemail to call office.  

## 2022-10-05 ENCOUNTER — Encounter: Payer: BC Managed Care – PPO | Admitting: Physician Assistant

## 2022-10-06 NOTE — Progress Notes (Signed)
No show

## 2022-10-08 ENCOUNTER — Encounter: Payer: Self-pay | Admitting: Physician Assistant

## 2022-10-08 ENCOUNTER — Ambulatory Visit: Payer: BC Managed Care – PPO | Admitting: Physician Assistant

## 2022-10-08 VITALS — BP 98/63 | HR 75 | Temp 97.7°F | Ht 66.0 in | Wt 151.4 lb

## 2022-10-08 DIAGNOSIS — M5451 Vertebrogenic low back pain: Secondary | ICD-10-CM | POA: Diagnosis not present

## 2022-10-08 DIAGNOSIS — K5909 Other constipation: Secondary | ICD-10-CM | POA: Diagnosis not present

## 2022-10-08 DIAGNOSIS — M542 Cervicalgia: Secondary | ICD-10-CM | POA: Diagnosis not present

## 2022-10-08 DIAGNOSIS — F909 Attention-deficit hyperactivity disorder, unspecified type: Secondary | ICD-10-CM

## 2022-10-08 MED ORDER — AMPHETAMINE-DEXTROAMPHETAMINE 15 MG PO TABS: 15.0000 mg | ORAL_TABLET | Freq: Every day | ORAL | 0 refills | Status: DC

## 2022-10-08 NOTE — Assessment & Plan Note (Signed)
She will try to drink more water. Cont supplements of fiber, stool softener daily. Stay active, exercise. GI if worse.

## 2022-10-08 NOTE — Assessment & Plan Note (Signed)
PDMP reviewed today, no red flags, filling appropriately.  Stable on Adderall 15 mg daily, will continue this dose. She is trying to establish with [employer].

## 2022-10-08 NOTE — Telephone Encounter (Signed)
Please see msg from patient in regards to medication sent to pharmacy

## 2022-10-08 NOTE — Progress Notes (Signed)
Subjective:    Patient ID: Isabella Singh, female    DOB: [birth year], 41 y.o.   MRN: [id number]  Chief Complaint  Patient presents with   Medication Refill    Pt wanting to discuss trying a different medication, pt having constipation and now has diverticulosis, doesn't know if this is due to medication; however pt is happy with medication she is on and how it is working     Medication Refill   Patient is in today for ADHD follow-up.  Trying to schedule with [facility]. She is taking Adderall 15 mg daily, wondering if she might need twice daily dosing. The XR version caused more constipation and disrupted her sleep.  No other symptoms or problems with the medication.  Stays on task much better while taking the medicine.  CT 05/20/22 showed constipation and diverticulosis. Trying to drink more water, mag citrate, psyllium husk, benefiber. Avoids triggering foods.    Past Medical History:  Diagnosis Date   ADHD (attention deficit hyperactivity disorder)    Asthma    B12 deficiency    "borderline"- gets B12 injections   Bradycardia    after surgery in 2011 at night when sleeping   Elevated antinuclear antibody (ANA) level    Endometriosis    sx 2008 (x1), 2018 (x2)   Epstein Barr infection    hx   Family history of adverse reaction to anesthesia    sisters "wake up too early"   Low blood pressure    exercise and stress induced hypotension   Mitral valve prolapse    with regurgitation, last looked at in 2013   Multinodular goiter (nontoxic)    post viral thyroiditis from [facility] Virus   Pneumonia    walkign PNA in 2007    Past Surgical History:  Procedure Laterality Date   APPENDECTOMY  10/2017   ENDOMETRIAL ABLATION     with fallopian tube cystectomy 2008   EXCISION OF ENDOMETRIOMA  10/2017   LUMBAR LAMINECTOMY/DECOMPRESSION MICRODISCECTOMY Left 07/02/2022   Procedure: Microdiscectomy Lumbar four-five Left;  Surgeon: Susa Day, MD;  Location: [facility];  Service: Orthopedics;  Laterality: Left;   NECK SURGERY  07/2017   c5 c6   PARTIAL HYSTERECTOMY  05/2017    Family History  Problem Relation Age of Onset   Allergies Mother    Arthritis Mother    Asthma Mother    Headache Mother    Headache Father    Headache Sister    Allergies Sister    Arthritis Sister    Allergies Brother    Headache Brother    Stroke Maternal Grandmother    Stroke Paternal Grandmother    Tuberculosis Other    Thyroid disease Neg Hx     Social History   Tobacco Use   Smoking status: Never   Smokeless tobacco: Never  Vaping Use   Vaping Use: Never used  Substance Use Topics   Alcohol use: Not Currently    Comment: occasion   Drug use: Never     Allergies  Allergen Reactions   Cyclobenzaprine Rash   Sulfa Antibiotics Rash   Egg White (Egg Protein)     Stomach pain   Gluten Meal Nausea Only and Other (See Comments)    GI Upset (intolerance), Lethargy (intolerance), Malaise (intolerance)   Mango Flavor [Flavoring Agent]     Mouth sore and lips get blisters    Review of Systems NEGATIVE UNLESS OTHERWISE INDICATED IN HPI      Objective:  BP 98/63 (BP Location: Left Arm)   Pulse 75   Temp 97.7 F (36.5 C) (Temporal)   Ht '5\' 6"'$  (1.676 m)   Wt 151 lb 6.4 oz (68.7 kg)   SpO2 100%   BMI 24.44 kg/m   Wt Readings from Last 3 Encounters:  10/08/22 151 lb 6.4 oz (68.7 kg)  10/05/22 147 lb (66.7 kg)  08/26/22 147 lb (66.7 kg)    BP Readings from Last 3 Encounters:  10/08/22 98/63  08/26/22 110/70  07/03/22 94/60     Physical Exam Constitutional:      Appearance: Normal appearance.  Cardiovascular:     Rate and Rhythm: Normal rate and regular rhythm.     Pulses: Normal pulses.     Heart sounds: No murmur heard. Pulmonary:     Effort: Pulmonary effort is normal.     Breath sounds: Normal breath sounds.  Neurological:     Mental Status: She is alert.  Psychiatric:        Mood and Affect: Mood normal.         Behavior: Behavior normal.        Assessment & Plan:  Attention deficit hyperactivity disorder (ADHD), unspecified ADHD type Assessment & Plan: PDMP reviewed today, no red flags, filling appropriately.  Stable on Adderall 15 mg daily, will continue this dose. She is trying to establish with [employer].    Other constipation Assessment & Plan: She will try to drink more water. Cont supplements of fiber, stool softener daily. Stay active, exercise. GI if worse.    Other orders -     Amphetamine-Dextroamphetamine; Take 1 tablet by mouth daily before breakfast.  Dispense: 30 tablet; Refill: 0 -     Amphetamine-Dextroamphetamine; Take 1 tablet by mouth daily before breakfast.  Dispense: 30 tablet; Refill: 0 -     Amphetamine-Dextroamphetamine; Take 1 tablet by mouth daily before breakfast.  Dispense: 30 tablet; Refill: 0        Return in about 3 months (around 01/08/2023) for recheck.    Alejandra Hunt M Sacha Radloff, PA-C

## 2022-10-09 ENCOUNTER — Encounter: Payer: Self-pay | Admitting: Physician Assistant

## 2022-10-09 ENCOUNTER — Other Ambulatory Visit: Payer: Self-pay | Admitting: Physician Assistant

## 2022-10-09 MED ORDER — AMPHETAMINE-DEXTROAMPHETAMINE 15 MG PO TABS: 15.0000 mg | ORAL_TABLET | Freq: Every day | ORAL | 0 refills | Status: DC

## 2022-10-11 ENCOUNTER — Telehealth: Payer: Self-pay | Admitting: Physician Assistant

## 2022-10-11 NOTE — Telephone Encounter (Signed)
Patient states Adderall 15 MG is not in stock.  Patient requests new RX for Adderall dosage to either 20 mg 1 x per day or 10 MG 2 x per day be sent to [facility], [place] - [address] Phone: [phone]  Fax: [phone]

## 2022-10-11 NOTE — Telephone Encounter (Signed)
Please see pt call msg and advise 

## 2022-10-12 ENCOUNTER — Other Ambulatory Visit: Payer: Self-pay | Admitting: Physician Assistant

## 2022-10-12 MED ORDER — AMPHETAMINE-DEXTROAMPHETAMINE 20 MG PO TABS: 20.0000 mg | ORAL_TABLET | Freq: Every day | ORAL | 0 refills | Status: DC

## 2022-10-12 NOTE — Telephone Encounter (Signed)
Pt responded on MyChart and this is what she was requesting from yesterday. Pharmacy still doesn't have medication needed, please advise

## 2022-10-12 NOTE — Telephone Encounter (Signed)
Called pt to advise Rx sent to [facility] and would resum '15mg'$  prescription hopefully next month if back in stock. Pt verbalized understanding.

## 2022-10-14 DIAGNOSIS — M26602 Left temporomandibular joint disorder, unspecified: Secondary | ICD-10-CM | POA: Diagnosis not present

## 2022-10-14 DIAGNOSIS — M9903 Segmental and somatic dysfunction of lumbar region: Secondary | ICD-10-CM | POA: Diagnosis not present

## 2022-10-14 DIAGNOSIS — M9902 Segmental and somatic dysfunction of thoracic region: Secondary | ICD-10-CM | POA: Diagnosis not present

## 2022-10-14 DIAGNOSIS — M9907 Segmental and somatic dysfunction of upper extremity: Secondary | ICD-10-CM | POA: Diagnosis not present

## 2022-10-14 DIAGNOSIS — M9901 Segmental and somatic dysfunction of cervical region: Secondary | ICD-10-CM | POA: Diagnosis not present

## 2022-10-14 DIAGNOSIS — M5412 Radiculopathy, cervical region: Secondary | ICD-10-CM | POA: Diagnosis not present

## 2022-10-22 DIAGNOSIS — J029 Acute pharyngitis, unspecified: Secondary | ICD-10-CM | POA: Diagnosis not present

## 2022-10-22 DIAGNOSIS — J45909 Unspecified asthma, uncomplicated: Secondary | ICD-10-CM | POA: Diagnosis not present

## 2022-12-11 ENCOUNTER — Emergency Department (HOSPITAL_BASED_OUTPATIENT_CLINIC_OR_DEPARTMENT_OTHER): Payer: BC Managed Care – PPO

## 2022-12-11 ENCOUNTER — Emergency Department (HOSPITAL_BASED_OUTPATIENT_CLINIC_OR_DEPARTMENT_OTHER)
Admission: EM | Admit: 2022-12-11 | Discharge: 2022-12-11 | Disposition: A | Discharge status: Home / Self Care | Payer: BC Managed Care – PPO | Attending: Emergency Medicine | Admitting: Emergency Medicine

## 2022-12-11 ENCOUNTER — Other Ambulatory Visit: Payer: Self-pay

## 2022-12-11 ENCOUNTER — Encounter (HOSPITAL_BASED_OUTPATIENT_CLINIC_OR_DEPARTMENT_OTHER): Payer: Self-pay

## 2022-12-11 DIAGNOSIS — Z79899 Other long term (current) drug therapy: Secondary | ICD-10-CM | POA: Insufficient documentation

## 2022-12-11 DIAGNOSIS — J45909 Unspecified asthma, uncomplicated: Secondary | ICD-10-CM | POA: Insufficient documentation

## 2022-12-11 DIAGNOSIS — R1032 Left lower quadrant pain: Secondary | ICD-10-CM | POA: Insufficient documentation

## 2022-12-11 DIAGNOSIS — R1030 Lower abdominal pain, unspecified: Secondary | ICD-10-CM

## 2022-12-11 DIAGNOSIS — N83201 Unspecified ovarian cyst, right side: Secondary | ICD-10-CM | POA: Diagnosis not present

## 2022-12-11 LAB — URINALYSIS, ROUTINE W REFLEX MICROSCOPIC
Bilirubin Urine: NEGATIVE
Glucose, UA: NEGATIVE mg/dL
Hgb urine dipstick: NEGATIVE
Ketones, ur: NEGATIVE mg/dL
Leukocytes,Ua: NEGATIVE
Nitrite: NEGATIVE
Protein, ur: NEGATIVE mg/dL
Specific Gravity, Urine: 1.005 (ref 1.005–1.030)
pH: 6 (ref 5.0–8.0)

## 2022-12-11 LAB — LIPASE, BLOOD: Lipase: 44 U/L (ref 11–51)

## 2022-12-11 LAB — COMPREHENSIVE METABOLIC PANEL
ALT: 12 U/L (ref 0–44)
AST: 14 U/L — ABNORMAL LOW (ref 15–41)
Albumin: 4.7 g/dL (ref 3.5–5.0)
Alkaline Phosphatase: 46 U/L (ref 38–126)
Anion gap: 9 (ref 5–15)
BUN: 12 mg/dL (ref 6–20)
CO2: 25 mmol/L (ref 22–32)
Calcium: 9.8 mg/dL (ref 8.9–10.3)
Chloride: 105 mmol/L (ref 98–111)
Creatinine, Ser: 0.74 mg/dL (ref 0.44–1.00)
GFR, Estimated: >60 mL/min (ref >60)
Glucose, Bld: 102 mg/dL — ABNORMAL HIGH (ref 70–99)
Potassium: 3.7 mmol/L (ref 3.5–5.1)
Sodium: 139 mmol/L (ref 135–145)
Total Bilirubin: 0.5 mg/dL (ref 0.3–1.2)
Total Protein: 7.3 g/dL (ref 6.5–8.1)

## 2022-12-11 LAB — CBC
HCT: 40.4 % (ref 36.0–46.0)
Hemoglobin: 13.3 g/dL (ref 12.0–15.0)
MCH: 30.6 pg (ref 26.0–34.0)
MCHC: 32.9 g/dL (ref 30.0–36.0)
MCV: 92.9 fL (ref 80.0–100.0)
Platelets: 389 10*3/uL (ref 150–400)
RBC: 4.35 MIL/uL (ref 3.87–5.11)
RDW: 12.6 % (ref 11.5–15.5)
WBC: 7.1 10*3/uL (ref 4.0–10.5)
nRBC: 0 % (ref 0.0–0.2)

## 2022-12-11 MED ORDER — IOHEXOL 300 MG/ML  SOLN
100.0000 mL | Freq: Once | INTRAMUSCULAR | Status: AC | PRN
  Administered 2022-12-11: 100 mL via INTRAVENOUS

## 2022-12-11 NOTE — ED Provider Notes (Signed)
[facility] Provider Note   CSN: [id number] Arrival date & time: 12/11/22  1222     History  Chief Complaint  Patient presents with   Abdominal Pain    Isabella Singh is a 42 y.o. female with a past medical history of ADHD, asthma, endometriosis status post endometrial ablation, appendicitis status post appendectomy presenting to the emergency department for evaluation of lower abdominal pain.  Patient reports that she has had left lower quadrant abdominal pain since New Year's Eve.  The pain is constant, radiating to her left hip. Denies fever, urinary symptoms, nausea, vomiting, bowel changes.   Abdominal Pain   Past Medical History:  Diagnosis Date   ADHD (attention deficit hyperactivity disorder)    Asthma    B12 deficiency    "borderline"- gets B12 injections   Bradycardia    after surgery in 2011 at night when sleeping   Elevated antinuclear antibody (ANA) level    Endometriosis    sx 2008 (x1), 2018 (x2)   Epstein Barr infection    hx   Family history of adverse reaction to anesthesia    sisters "wake up too early"   Low blood pressure    exercise and stress induced hypotension   Mitral valve prolapse    with regurgitation, last looked at in 2013   Multinodular goiter (nontoxic)    post viral thyroiditis from [facility] Virus   Pneumonia    walkign PNA in 2007   Past Surgical History:  Procedure Laterality Date   APPENDECTOMY  10/2017   ENDOMETRIAL ABLATION     with fallopian tube cystectomy 2008   EXCISION OF ENDOMETRIOMA  10/2017   LUMBAR LAMINECTOMY/DECOMPRESSION MICRODISCECTOMY Left 07/02/2022   Procedure: Microdiscectomy Lumbar four-five Left;  Surgeon: Isabella Day, MD;  Location: [facility];  Service: Orthopedics;  Laterality: Left;   NECK SURGERY  07/2017   c5 c6   PARTIAL HYSTERECTOMY  05/2017     Home Medications Prior to Admission medications   Medication Sig Start Date End Date Taking? Authorizing Provider   amphetamine-dextroamphetamine (ADDERALL) 20 MG tablet Take 1 tablet (20 mg total) by mouth daily. 10/12/22   Allwardt, Alyssa M, PA-C  albuterol (PROVENTIL) (2.5 MG/3ML) 0.083% nebulizer solution Take 3 mLs (2.5 mg total) by nebulization every 6 (six) hours as needed for wheezing or shortness of breath. 03/03/22   Allwardt, Alyssa M, PA-C  albuterol (VENTOLIN HFA) 108 (90 Base) MCG/ACT inhaler Inhale 2 puffs into the lungs every 6 (six) hours as needed for wheezing or shortness of breath. 01/01/21   Orma Flaming, MD  amphetamine-dextroamphetamine (ADDERALL) 15 MG tablet TAKE 1 TABLET BY MOUTH EVERY [facility] 08/18/22   Allwardt, Randa Evens, PA-C  amphetamine-dextroamphetamine (ADDERALL) 15 MG tablet Take 1 tablet by mouth daily before breakfast. 11/07/22 12/07/22  Allwardt, Randa Evens, PA-C  amphetamine-dextroamphetamine (ADDERALL) 15 MG tablet Take 1 tablet by mouth daily before breakfast. 12/07/22 01/06/23  Allwardt, Randa Evens, PA-C  amphetamine-dextroamphetamine (ADDERALL) 15 MG tablet Take 1 tablet by mouth daily before breakfast. 10/09/22 11/08/22  Allwardt, Randa Evens, PA-C  celecoxib (CELEBREX) 200 MG capsule Take 200 mg by mouth daily.    [provider]  Cyanocobalamin (B-12 PO) Take 1 capsule by mouth daily.    [provider]  Probiotic Product (PROBIOTIC PO) Take 1 capsule by mouth daily.    [provider]      Allergies    Cyclobenzaprine, Sulfa antibiotics, Egg white (egg protein), Gluten meal, and Mango flavor [flavoring  agent]    Review of Systems   Review of Systems  Gastrointestinal:  Positive for abdominal pain.    Physical Exam Updated Vital Signs BP (!) 106/59 (BP Location: Right Arm)   Pulse 81   Temp 98.5 F (36.9 C) (Oral)   Resp 18   Ht '5\' 6"'$  (1.676 m)   Wt 63.5 kg   SpO2 100%   BMI 22.60 kg/m  Physical Exam Vitals and nursing note reviewed.  Constitutional:      Appearance: Normal appearance.  HENT:     Head: Normocephalic and atraumatic.      Mouth/Throat:     Mouth: Mucous membranes are moist.  Eyes:     General: No scleral icterus. Cardiovascular:     Rate and Rhythm: Normal rate and regular rhythm.     Pulses: Normal pulses.     Heart sounds: Normal heart sounds.  Pulmonary:     Effort: Pulmonary effort is normal.     Breath sounds: Normal breath sounds.  Abdominal:     General: Abdomen is flat.     Palpations: Abdomen is soft.     Tenderness: There is abdominal tenderness in the suprapubic area and left lower quadrant.  Musculoskeletal:        General: No deformity.  Skin:    General: Skin is warm.     Findings: No rash.  Neurological:     General: No focal deficit present.     Mental Status: She is alert.  Psychiatric:        Mood and Affect: Mood normal.     ED Results / Procedures / Treatments   Labs (all labs ordered are listed, but only abnormal results are displayed) Labs Reviewed  COMPREHENSIVE METABOLIC PANEL - Abnormal; Notable for the following components:      Result Value   Glucose, Bld 102 (*)    AST 14 (*)    All other components within normal limits  URINALYSIS, ROUTINE W REFLEX MICROSCOPIC - Abnormal; Notable for the following components:   Color, Urine COLORLESS (*)    All other components within normal limits  LIPASE, BLOOD  CBC    EKG None  Radiology CT ABDOMEN PELVIS W CONTRAST  Result Date: 12/11/2022 CLINICAL DATA:  Left lower quadrant abdominal pain for 5 days. Diarrhea with constipation. History of diverticulitis. EXAM: CT ABDOMEN AND PELVIS WITH CONTRAST TECHNIQUE: Multidetector CT imaging of the abdomen and pelvis was performed using the standard protocol following bolus administration of intravenous contrast. RADIATION DOSE REDUCTION: This exam was performed according to the departmental dose-optimization program which includes automated exposure control, adjustment of the mA and/or kV according to patient size and/or use of iterative reconstruction technique. CONTRAST:   155m OMNIPAQUE IOHEXOL 300 MG/ML  SOLN COMPARISON:  Noncontrast abdominopelvic CT 05/20/2022. FINDINGS: Lower chest: Clear lung bases. No significant pleural or pericardial effusion. Hepatobiliary: The liver is normal in density without suspicious focal abnormality. No evidence of gallstones, gallbladder wall thickening or biliary dilatation. Pancreas: Unremarkable. No pancreatic ductal dilatation or surrounding inflammatory changes. Spleen: Normal in size without focal abnormality. Adrenals/Urinary Tract: Both adrenal glands appear normal. No evidence of urinary tract calculus, suspicious renal lesion or hydronephrosis. The bladder appears unremarkable for its degree of distention. Stomach/Bowel: No enteric contrast administered. The stomach appears unremarkable for its degree of distension. No evidence of bowel wall thickening, distention or surrounding inflammatory change. History of appendectomy. Mildly prominent stool throughout the colon. Vascular/Lymphatic: There are no enlarged abdominal or pelvic lymph nodes.  No significant vascular findings. Reproductive: The uterus appears unremarkable. 3.1 cm right ovarian cyst on image 59/2 demonstrates no suspicious characteristics. No follow-up imaging is recommended. Reference: JACR 2020 Feb;17(2):248-254 Other: No evidence of abdominal wall mass or hernia. No ascites. Musculoskeletal: No acute or significant osseous findings. Lower lumbar spondylosis noted, most advanced at L5-S1. IMPRESSION: 1. No acute findings or explanation for the patient's symptoms. 2. Mildly prominent stool throughout the colon consistent with constipation. 3. Lower lumbar spondylosis. Electronically Signed   By: Richardean Sale M.D.   On: 12/11/2022 18:08    Procedures Procedures    Medications Ordered in ED Medications  iohexol (OMNIPAQUE) 300 MG/ML solution 100 mL (100 mLs Intravenous Contrast Given 12/11/22 1751)    ED Course/ Medical Decision Making/ A&P                            Medical Decision Making Amount and/or Complexity of Data Reviewed Labs: ordered. Radiology: ordered.  Risk Prescription drug management.   This patient presents to the ED for lower abdominal pain, this involves an extensive number of treatment options, and is a complaint that carries with a high risk of complications and morbidity.  The differential diagnosis includes diverticulitis, hernia, diverticulitis, UTI, constipation, ectopic pregnancy, ovarian torsion, ovarian cyst, PID/TOA, period/fibroid. This is not an exhaustive list.  Lab tests: I ordered and personally interpreted labs.  The pertinent results include: WBC unremarkable. Hbg unremarkable. Platelets unremarkable. No electrolyte abnormalities noted.  BUN, creatinine unremarkable. UA significant for no acute abnormality.  Imaging studies: I ordered imaging studies. I personally reviewed, interpreted imaging and agree with the radiologist's interpretations. The results include: mildly prominent stool throughout the colon consistent with constipation   Problem list/ ED course/ Critical interventions/ Medical management: HPI: See above Vital signs within normal range and stable throughout visit. Laboratory/imaging studies significant for: See above. On physical examination, patient is afebrile and appears in no acute distress. Patient presents with lower abdominal pain/pelvic pain. Abdominal exam without peritoneal signs. No evidence of acute abdomen at this time. Well appearing. Considered and doubt ovarian torsion given history and presentation. Given work up low suspicion for acute hepatobiliary disease (including acute cholecystitis), acute pancreatitis (neg lipase), PUD and gastric perforation, acute infectious processes (pneumonia, hepatitis, pyelonephritis), acute appendicitis, vascular catastrophe, bowel obstruction or viscus perforation, diverticulitis. Presentation not consistent with other acute, emergent causes of  abdominal pain at this time. Based on patient's clinical presentations and laboratory/imaging studies I suspect pain caused by constipation. Advised patient to follow up with Gi for further evaluation and management.  Return to the ER if new or worsening symptoms.. I have reviewed the patient home medicines and have made adjustments as needed.  Cardiac monitoring/EKG: The patient was maintained on a cardiac monitor.  I personally reviewed and interpreted the cardiac monitor which showed an underlying rhythm of: sinus rhythm.  Additional history obtained: External records from outside source obtained and reviewed including: Chart review including previous notes, labs, imaging.  Consultations obtained:  Disposition Continued outpatient therapy. Follow-up with GI recommended for reevaluation of symptoms. Treatment plan discussed with patient.  Pt acknowledged understanding was agreeable to the plan. Worrisome signs and symptoms were discussed with patient, and patient acknowledged understanding to return to the ED if they noticed these signs and symptoms. Patient was stable upon discharge.   This chart was dictated using voice recognition software.  Despite best efforts to proofread,  errors can occur which can change  the documentation meaning.          Final Clinical Impression(s) / ED Diagnoses Final diagnoses:  Lower abdominal pain    Rx / DC Orders ED Discharge Orders     None         Rex Kras, [place] 12/11/22 2121    Charlesetta Shanks, MD 12/15/22 1557

## 2022-12-11 NOTE — Discharge Instructions (Addendum)
Your lab and imaging was reassuring today.  Please follow up with gastroenterology or your primary care doctor within 2-3 days. For constipation we also recommend a diet high in fiber (beans, fruits, vegetables, whole grains). Take Colace 100-200 mg up to three times per day. You may take along with Senokot 1-2 tabs, ingest with full glass of water.  You may also take MiraLAX 1-2 capfuls 1-2 times a day until stools become soft and then slowly decrease the amount of MiraLAX used.  Maintain fluid intake 6-8 glasses per day. Please increase fibers in your diet. You may also take Milk of Magnesia 30 mL as needed for constipation, you may repeat in 2 hours again if no bowl movement.

## 2022-12-11 NOTE — ED Triage Notes (Addendum)
Pt presents POV from home with left lower abdominal pain since Monday, denies nausea, denies fevers.  Reports diarrhea and constipation.  Has history of diverticulosis and rectocele.   Reports left hip, leg pain, says her left foot feels colder than right.  Reports lumbar surgery in July.  Pt ambulatory to triage, in NAD.

## 2022-12-13 DIAGNOSIS — M9902 Segmental and somatic dysfunction of thoracic region: Secondary | ICD-10-CM | POA: Diagnosis not present

## 2022-12-13 DIAGNOSIS — M9907 Segmental and somatic dysfunction of upper extremity: Secondary | ICD-10-CM | POA: Diagnosis not present

## 2022-12-13 DIAGNOSIS — M9903 Segmental and somatic dysfunction of lumbar region: Secondary | ICD-10-CM | POA: Diagnosis not present

## 2022-12-13 DIAGNOSIS — M9901 Segmental and somatic dysfunction of cervical region: Secondary | ICD-10-CM | POA: Diagnosis not present

## 2022-12-13 DIAGNOSIS — M26602 Left temporomandibular joint disorder, unspecified: Secondary | ICD-10-CM | POA: Diagnosis not present

## 2022-12-13 DIAGNOSIS — M5412 Radiculopathy, cervical region: Secondary | ICD-10-CM | POA: Diagnosis not present

## 2022-12-28 DIAGNOSIS — M6281 Muscle weakness (generalized): Secondary | ICD-10-CM | POA: Diagnosis not present

## 2022-12-28 DIAGNOSIS — M62838 Other muscle spasm: Secondary | ICD-10-CM | POA: Diagnosis not present

## 2022-12-28 DIAGNOSIS — R102 Pelvic and perineal pain: Secondary | ICD-10-CM | POA: Diagnosis not present

## 2023-01-03 DIAGNOSIS — M9905 Segmental and somatic dysfunction of pelvic region: Secondary | ICD-10-CM | POA: Diagnosis not present

## 2023-01-03 DIAGNOSIS — M9901 Segmental and somatic dysfunction of cervical region: Secondary | ICD-10-CM | POA: Diagnosis not present

## 2023-01-03 DIAGNOSIS — M5412 Radiculopathy, cervical region: Secondary | ICD-10-CM | POA: Diagnosis not present

## 2023-01-03 DIAGNOSIS — M9903 Segmental and somatic dysfunction of lumbar region: Secondary | ICD-10-CM | POA: Diagnosis not present

## 2023-01-03 DIAGNOSIS — M9907 Segmental and somatic dysfunction of upper extremity: Secondary | ICD-10-CM | POA: Diagnosis not present

## 2023-01-03 DIAGNOSIS — M26602 Left temporomandibular joint disorder, unspecified: Secondary | ICD-10-CM | POA: Diagnosis not present

## 2023-01-03 DIAGNOSIS — M9902 Segmental and somatic dysfunction of thoracic region: Secondary | ICD-10-CM | POA: Diagnosis not present

## 2023-01-05 DIAGNOSIS — G43909 Migraine, unspecified, not intractable, without status migrainosus: Secondary | ICD-10-CM | POA: Insufficient documentation

## 2023-01-11 DIAGNOSIS — M6281 Muscle weakness (generalized): Secondary | ICD-10-CM | POA: Diagnosis not present

## 2023-01-11 DIAGNOSIS — R102 Pelvic and perineal pain: Secondary | ICD-10-CM | POA: Diagnosis not present

## 2023-01-11 DIAGNOSIS — M62838 Other muscle spasm: Secondary | ICD-10-CM | POA: Diagnosis not present

## 2023-01-21 DIAGNOSIS — Z882 Allergy status to sulfonamides status: Secondary | ICD-10-CM | POA: Diagnosis not present

## 2023-01-21 DIAGNOSIS — H43391 Other vitreous opacities, right eye: Secondary | ICD-10-CM | POA: Diagnosis not present

## 2023-01-21 DIAGNOSIS — Z888 Allergy status to other drugs, medicaments and biological substances status: Secondary | ICD-10-CM | POA: Diagnosis not present

## 2023-01-21 DIAGNOSIS — X58XXXD Exposure to other specified factors, subsequent encounter: Secondary | ICD-10-CM | POA: Diagnosis not present

## 2023-01-21 DIAGNOSIS — S058X1D Other injuries of right eye and orbit, subsequent encounter: Secondary | ICD-10-CM | POA: Diagnosis not present

## 2023-01-21 DIAGNOSIS — S058X1A Other injuries of right eye and orbit, initial encounter: Secondary | ICD-10-CM | POA: Diagnosis not present

## 2023-01-27 DIAGNOSIS — M9902 Segmental and somatic dysfunction of thoracic region: Secondary | ICD-10-CM | POA: Diagnosis not present

## 2023-01-27 DIAGNOSIS — M5412 Radiculopathy, cervical region: Secondary | ICD-10-CM | POA: Diagnosis not present

## 2023-01-27 DIAGNOSIS — M9907 Segmental and somatic dysfunction of upper extremity: Secondary | ICD-10-CM | POA: Diagnosis not present

## 2023-01-27 DIAGNOSIS — M9905 Segmental and somatic dysfunction of pelvic region: Secondary | ICD-10-CM | POA: Diagnosis not present

## 2023-01-27 DIAGNOSIS — M26602 Left temporomandibular joint disorder, unspecified: Secondary | ICD-10-CM | POA: Diagnosis not present

## 2023-01-27 DIAGNOSIS — M9901 Segmental and somatic dysfunction of cervical region: Secondary | ICD-10-CM | POA: Diagnosis not present

## 2023-01-27 DIAGNOSIS — M9903 Segmental and somatic dysfunction of lumbar region: Secondary | ICD-10-CM | POA: Diagnosis not present

## 2023-02-04 DIAGNOSIS — M9901 Segmental and somatic dysfunction of cervical region: Secondary | ICD-10-CM | POA: Diagnosis not present

## 2023-02-04 DIAGNOSIS — M9907 Segmental and somatic dysfunction of upper extremity: Secondary | ICD-10-CM | POA: Diagnosis not present

## 2023-02-04 DIAGNOSIS — M26602 Left temporomandibular joint disorder, unspecified: Secondary | ICD-10-CM | POA: Diagnosis not present

## 2023-02-04 DIAGNOSIS — M9903 Segmental and somatic dysfunction of lumbar region: Secondary | ICD-10-CM | POA: Diagnosis not present

## 2023-02-04 DIAGNOSIS — M9905 Segmental and somatic dysfunction of pelvic region: Secondary | ICD-10-CM | POA: Diagnosis not present

## 2023-02-04 DIAGNOSIS — M5412 Radiculopathy, cervical region: Secondary | ICD-10-CM | POA: Diagnosis not present

## 2023-02-04 DIAGNOSIS — M9902 Segmental and somatic dysfunction of thoracic region: Secondary | ICD-10-CM | POA: Diagnosis not present

## 2023-03-10 DIAGNOSIS — M9907 Segmental and somatic dysfunction of upper extremity: Secondary | ICD-10-CM | POA: Diagnosis not present

## 2023-03-10 DIAGNOSIS — M9905 Segmental and somatic dysfunction of pelvic region: Secondary | ICD-10-CM | POA: Diagnosis not present

## 2023-03-10 DIAGNOSIS — M9903 Segmental and somatic dysfunction of lumbar region: Secondary | ICD-10-CM | POA: Diagnosis not present

## 2023-03-10 DIAGNOSIS — M5412 Radiculopathy, cervical region: Secondary | ICD-10-CM | POA: Diagnosis not present

## 2023-03-10 DIAGNOSIS — M26602 Left temporomandibular joint disorder, unspecified: Secondary | ICD-10-CM | POA: Diagnosis not present

## 2023-03-10 DIAGNOSIS — M9901 Segmental and somatic dysfunction of cervical region: Secondary | ICD-10-CM | POA: Diagnosis not present

## 2023-03-10 DIAGNOSIS — M9902 Segmental and somatic dysfunction of thoracic region: Secondary | ICD-10-CM | POA: Diagnosis not present

## 2023-03-26 DIAGNOSIS — R519 Headache, unspecified: Secondary | ICD-10-CM | POA: Diagnosis not present

## 2023-03-26 DIAGNOSIS — R112 Nausea with vomiting, unspecified: Secondary | ICD-10-CM | POA: Diagnosis not present

## 2023-03-26 DIAGNOSIS — R5383 Other fatigue: Secondary | ICD-10-CM | POA: Diagnosis not present

## 2023-03-26 DIAGNOSIS — J029 Acute pharyngitis, unspecified: Secondary | ICD-10-CM | POA: Diagnosis not present

## 2023-03-28 ENCOUNTER — Other Ambulatory Visit: Payer: Self-pay | Admitting: Physician Assistant

## 2023-03-28 NOTE — Telephone Encounter (Signed)
Last OV: 10/08/22  Next OV: None scheduled  Last Filled: 12/07/22  Quantity: 30 w/ 0 refills

## 2023-04-04 ENCOUNTER — Encounter: Payer: Self-pay | Admitting: Physician Assistant

## 2023-04-04 ENCOUNTER — Ambulatory Visit: Payer: BC Managed Care – PPO | Admitting: Physician Assistant

## 2023-04-04 NOTE — Telephone Encounter (Signed)
Please see patient message from missed appt this morning and advise

## 2023-04-06 ENCOUNTER — Encounter: Payer: Self-pay | Admitting: Physician Assistant

## 2023-04-06 ENCOUNTER — Telehealth (INDEPENDENT_AMBULATORY_CARE_PROVIDER_SITE_OTHER): Payer: BC Managed Care – PPO | Admitting: Physician Assistant

## 2023-04-06 VITALS — Ht 66.0 in | Wt 140.0 lb

## 2023-04-06 DIAGNOSIS — F909 Attention-deficit hyperactivity disorder, unspecified type: Secondary | ICD-10-CM

## 2023-04-06 DIAGNOSIS — R519 Headache, unspecified: Secondary | ICD-10-CM | POA: Diagnosis not present

## 2023-04-06 DIAGNOSIS — M542 Cervicalgia: Secondary | ICD-10-CM

## 2023-04-06 MED ORDER — METHYLPHENIDATE HCL ER 18 MG PO TB24: 18.0000 mg | ORAL_TABLET | Freq: Every day | ORAL | 0 refills | Status: DC

## 2023-04-06 NOTE — Assessment & Plan Note (Signed)
Working with Dr. Ethelene Hal Needs new referral to neurologist, placed today for her

## 2023-04-06 NOTE — Progress Notes (Signed)
   Virtual Visit via Video Note  I connected with  Isabella Singh  on 04/06/23 at 11:45 AM EDT by a video enabled telemedicine application and verified that I am speaking with the correct person using two identifiers.  Location: Patient: home Provider: Nature conservation officer at [employer] Persons present: Patient and myself   I discussed the limitations of evaluation and management by telemedicine and the availability of in person appointments. The patient expressed understanding and agreed to proceed.   History of Present Illness:  42 yo female presenting for VV to discuss ADHD, chronic fatigue.  Does fine with Adderall, but wondering about trying a different stimulant due to constipation concerns. Working with a functional doctor, says she is having slow motility in GI tract.  When she doesn't take the adderall, she has excessive daytime sleepiness.   Also requesting referral to Neurology - [facility]; she has been working with Dr. Ethelene Hal about her neck pain and headaches, he recommended another eval with this specialist / r/o dystonia.    Observations/Objective:   Gen: Awake, alert, no acute distress Resp: Breathing is even and non-labored Psych: calm/pleasant demeanor Neuro: Alert and Oriented x 3, + facial symmetry, speech is clear.   Assessment and Plan:  Problem List Items Addressed This Visit       Other   Neck pain    Working with Dr. Ethelene Hal Needs new referral to neurologist, placed today for her      Relevant Orders   Ambulatory referral to Neurology   Attention deficit disorder - Primary    PDMP reviewed today, no red flags, filling appropriately.  Stable on Adderall 15 mg daily for years. Pt complaining of worsening constipation issues. She is requesting to try something different to see if symptoms improve. Will Stop Adderall at this time. Start on methylphenidate 18 MG PO CR tablet  Pt aware of risks vs benefits and  possible adverse reactions F/up in 30 days, call sooner if questions / concerns.       Other Visit Diagnoses     Frequent headaches       Relevant Orders   Ambulatory referral to Neurology        Follow Up Instructions:    I discussed the assessment and treatment plan with the patient. The patient was provided an opportunity to ask questions and all were answered. The patient agreed with the plan and demonstrated an understanding of the instructions.   The patient was advised to call back or seek an in-person evaluation if the symptoms worsen or if the condition fails to improve as anticipated.  Varnell Donate M Xaria Judon, PA-C

## 2023-04-06 NOTE — Assessment & Plan Note (Signed)
PDMP reviewed today, no red flags, filling appropriately.  Stable on Adderall 15 mg daily for years. Pt complaining of worsening constipation issues. She is requesting to try something different to see if symptoms improve. Will Stop Adderall at this time. Start on methylphenidate 18 MG PO CR tablet  Pt aware of risks vs benefits and possible adverse reactions F/up in 30 days, call sooner if questions / concerns.

## 2023-05-10 ENCOUNTER — Other Ambulatory Visit: Payer: Self-pay | Admitting: Physician Assistant

## 2023-05-10 NOTE — Telephone Encounter (Signed)
Last OV: 04/06/23  Next OV: none  Last filled: 04/06/23  Quantity: 30 tablets

## 2023-05-10 NOTE — Telephone Encounter (Signed)
Please see pharmacy msg, pt insurance doesn't cover brand name medication please advise

## 2023-05-11 NOTE — Telephone Encounter (Addendum)
[facility]ate [facility] stock but [facility] @ [address] does. Requests this be sent there.

## 2023-05-11 NOTE — Telephone Encounter (Signed)
Patient was advised but still receiving from pharmacy

## 2023-05-12 ENCOUNTER — Telehealth: Payer: Self-pay

## 2023-05-12 NOTE — Telephone Encounter (Signed)
Pt waiting for information on PA approval for Concerta please advise

## 2023-05-13 ENCOUNTER — Other Ambulatory Visit: Payer: Self-pay | Admitting: Physician Assistant

## 2023-05-13 MED ORDER — METHYLPHENIDATE HCL ER (OSM) 18 MG PO TBCR: 18.0000 mg | EXTENDED_RELEASE_TABLET | Freq: Every day | ORAL | 0 refills | Status: DC

## 2023-05-13 NOTE — Telephone Encounter (Signed)
Please see pt msg and advise 

## 2023-05-13 NOTE — Telephone Encounter (Signed)
Patient states [facility] on [address] has generic Concerta in stock.  Requests RX for Generic Concerta be sent (if PA for name brand is going to take too long) be sent to:  [facility] located on [address] in [place]   Patient requests to be advised

## 2023-05-16 ENCOUNTER — Other Ambulatory Visit (HOSPITAL_COMMUNITY): Payer: Self-pay

## 2023-05-16 ENCOUNTER — Telehealth: Payer: Self-pay

## 2023-05-16 NOTE — Telephone Encounter (Signed)
Pharmacy Patient Advocate Encounter   Received notification that prior authorization for Concerta 18mg  ER tabs is required/requested.   PA submitted to  [employer]   via [employer]  # [id number] Status is available without authorization

## 2023-05-16 NOTE — Telephone Encounter (Signed)
Does this mean authorization was approved?

## 2023-05-17 ENCOUNTER — Other Ambulatory Visit (HOSPITAL_COMMUNITY): Payer: Self-pay

## 2023-05-17 DIAGNOSIS — K5730 Diverticulosis of large intestine without perforation or abscess without bleeding: Secondary | ICD-10-CM | POA: Diagnosis not present

## 2023-05-17 DIAGNOSIS — K5904 Chronic idiopathic constipation: Secondary | ICD-10-CM | POA: Diagnosis not present

## 2023-05-17 NOTE — Telephone Encounter (Signed)
Per CMM, Concerta is available without authorization. Ran test claim, came back refill too soon. Last filled 05/13/23

## 2023-05-17 NOTE — Telephone Encounter (Signed)
Thank you for the update and information.

## 2023-06-01 DIAGNOSIS — M9902 Segmental and somatic dysfunction of thoracic region: Secondary | ICD-10-CM | POA: Diagnosis not present

## 2023-06-01 DIAGNOSIS — M9907 Segmental and somatic dysfunction of upper extremity: Secondary | ICD-10-CM | POA: Diagnosis not present

## 2023-06-01 DIAGNOSIS — M26602 Left temporomandibular joint disorder, unspecified: Secondary | ICD-10-CM | POA: Diagnosis not present

## 2023-06-01 DIAGNOSIS — M9901 Segmental and somatic dysfunction of cervical region: Secondary | ICD-10-CM | POA: Diagnosis not present

## 2023-06-01 DIAGNOSIS — M5412 Radiculopathy, cervical region: Secondary | ICD-10-CM | POA: Diagnosis not present

## 2023-06-07 ENCOUNTER — Other Ambulatory Visit: Payer: Self-pay | Admitting: Physician Assistant

## 2023-06-08 MED ORDER — METHYLPHENIDATE HCL ER (OSM) 18 MG PO TBCR: 18.0000 mg | EXTENDED_RELEASE_TABLET | Freq: Every day | ORAL | 0 refills | Status: DC

## 2023-06-16 ENCOUNTER — Encounter: Payer: BC Managed Care – PPO | Admitting: Physician Assistant

## 2023-06-16 NOTE — Progress Notes (Signed)
Rescheduled

## 2023-06-27 ENCOUNTER — Telehealth (INDEPENDENT_AMBULATORY_CARE_PROVIDER_SITE_OTHER): Payer: BC Managed Care – PPO | Admitting: Physician Assistant

## 2023-06-27 VITALS — Ht 66.0 in

## 2023-06-27 DIAGNOSIS — F909 Attention-deficit hyperactivity disorder, unspecified type: Secondary | ICD-10-CM

## 2023-06-27 MED ORDER — METHYLPHENIDATE HCL ER (OSM) 27 MG PO TBCR: 27.0000 mg | EXTENDED_RELEASE_TABLET | Freq: Every day | ORAL | 0 refills | Status: DC

## 2023-06-27 NOTE — Progress Notes (Signed)
   Virtual Visit via Video Note  I connected with  Isabella Singh  on 06/27/23 at  8:30 AM EDT by a video enabled telemedicine application and verified that I am speaking with the correct person using two identifiers.  Location: Patient: home Provider: Nature conservation officer at [employer] Persons present: Patient and myself   I discussed the limitations of evaluation and management by telemedicine and the availability of in person appointments. The patient expressed understanding and agreed to proceed.   History of Present Illness:  42 yo presenting for VV to recheck on ADHD.    Observations/Objective:   Gen: Awake, alert, no acute distress Resp: Breathing is even and non-labored Psych: calm/pleasant demeanor Neuro: Alert and Oriented x 3, + facial symmetry, speech is clear.   Assessment and Plan:  Problem List Items Addressed This Visit       Other   Attention deficit disorder - Primary    PDMP reviewed today, no red flags, filling appropriately.  Switched from Adderall to Concerta due to constipation issues.  Heart racing symptoms are better, stomach issues are doing better, but still having a lot of daytime sleepiness. Also has a lot of stress right now with her son's surgeries. Plan to increase Concerta to 27 mg daily. Pt aware of risks vs benefits and possible adverse reactions F/up in 3 months or prn.          Follow Up Instructions:    I discussed the assessment and treatment plan with the patient. The patient was provided an opportunity to ask questions and all were answered. The patient agreed with the plan and demonstrated an understanding of the instructions.   The patient was advised to call back or seek an in-person evaluation if the symptoms worsen or if the condition fails to improve as anticipated.  Champion Corales M Cythia Bachtel, PA-C

## 2023-06-27 NOTE — Assessment & Plan Note (Signed)
PDMP reviewed today, no red flags, filling appropriately.  Switched from Adderall to Concerta due to constipation issues.  Heart racing symptoms are better, stomach issues are doing better, but still having a lot of daytime sleepiness. Also has a lot of stress right now with her son's surgeries. Plan to increase Concerta to 27 mg daily. Pt aware of risks vs benefits and possible adverse reactions F/up in 3 months or prn.

## 2023-07-08 DIAGNOSIS — G43109 Migraine with aura, not intractable, without status migrainosus: Secondary | ICD-10-CM | POA: Diagnosis not present

## 2023-07-08 DIAGNOSIS — S0300XA Dislocation of jaw, unspecified side, initial encounter: Secondary | ICD-10-CM | POA: Diagnosis not present

## 2023-07-08 DIAGNOSIS — M542 Cervicalgia: Secondary | ICD-10-CM | POA: Diagnosis not present

## 2023-08-01 DIAGNOSIS — M436 Torticollis: Secondary | ICD-10-CM | POA: Diagnosis not present

## 2023-08-01 DIAGNOSIS — M530 Cervicocranial syndrome: Secondary | ICD-10-CM | POA: Diagnosis not present

## 2023-08-16 ENCOUNTER — Telehealth (INDEPENDENT_AMBULATORY_CARE_PROVIDER_SITE_OTHER): Payer: Medicaid Other | Admitting: Physician Assistant

## 2023-08-16 VITALS — Temp 97.7°F

## 2023-08-16 DIAGNOSIS — R5382 Chronic fatigue, unspecified: Secondary | ICD-10-CM

## 2023-08-16 DIAGNOSIS — F909 Attention-deficit hyperactivity disorder, unspecified type: Secondary | ICD-10-CM | POA: Diagnosis not present

## 2023-08-16 MED ORDER — METHYLPHENIDATE HCL ER (OSM) 27 MG PO TBCR: 27.0000 mg | EXTENDED_RELEASE_TABLET | Freq: Every day | ORAL | 0 refills | Status: DC

## 2023-08-16 NOTE — Progress Notes (Signed)
   Virtual Visit via Video Note  I connected with  Isabella Singh  on 08/16/23 at 11:15 AM EDT by a video enabled telemedicine application and verified that I am speaking with the correct person using two identifiers.  Location: Patient: Parked car Provider: Nature conservation officer at [employer] Persons present: Patient and myself   I discussed the limitations of evaluation and management by telemedicine and the availability of in person appointments. The patient expressed understanding and agreed to proceed.   History of Present Illness:  42 yo female presenting for some concerns with her medication.  She is having full tablets of Concerta and sometimes magnesium tablets in her stool.  Working with GI on this issue as well.  Increase in Concerta dose from last visit has definitely helped with chronic fatigue and ADHD.   Observations/Objective:   Gen: Awake, alert, no acute distress Resp: Breathing is even and non-labored Psych: calm/pleasant demeanor Neuro: Alert and Oriented x 3, + facial symmetry, speech is clear.   Assessment and Plan:  Problem List Items Addressed This Visit       Other   Attention deficit disorder - Primary    PDMP reviewed today, no red flags, filling appropriately.  Switched from Adderall to Concerta due to constipation issues.  Heart racing symptoms are better, stomach issues are doing better. Increase to Concerta 27 mg has been beneficial. Reassured pt of Concerta's FDA label: "The CONCERTA tablet does not dissolve completely in  the body after all the medicine has been released. You or  your child may sometimes notice the empty tablet in a  bowel movement. This is normal."  Continue Concerta 27 mg at this time.  Pt aware of risks vs benefits and possible adverse reactions F/up in 3 months or prn.        Relevant Medications   methylphenidate (CONCERTA) 27 MG PO CR tablet   Other Visit Diagnoses     Chronic fatigue        Relevant Medications   methylphenidate (CONCERTA) 27 MG PO CR tablet        Follow Up Instructions:    I discussed the assessment and treatment plan with the patient. The patient was provided an opportunity to ask questions and all were answered. The patient agreed with the plan and demonstrated an understanding of the instructions.   The patient was advised to call back or seek an in-person evaluation if the symptoms worsen or if the condition fails to improve as anticipated.  Katanya Schlie M Ayan Heffington, PA-C

## 2023-08-16 NOTE — Assessment & Plan Note (Signed)
PDMP reviewed today, no red flags, filling appropriately.  Switched from Adderall to Concerta due to constipation issues.  Heart racing symptoms are better, stomach issues are doing better. Increase to Concerta 27 mg has been beneficial. Reassured pt of Concerta's FDA label: "The CONCERTA tablet does not dissolve completely in  the body after all the medicine has been released. You or  your child may sometimes notice the empty tablet in a  bowel movement. This is normal."  Continue Concerta 27 mg at this time.  Pt aware of risks vs benefits and possible adverse reactions F/up in 3 months or prn.

## 2023-09-28 ENCOUNTER — Ambulatory Visit: Payer: Medicaid Other | Admitting: Nurse Practitioner

## 2023-09-28 ENCOUNTER — Encounter: Payer: Self-pay | Admitting: Nurse Practitioner

## 2023-09-28 VITALS — BP 100/60 | HR 86 | Temp 98.4°F | Ht 66.0 in | Wt 156.2 lb

## 2023-09-28 DIAGNOSIS — H9203 Otalgia, bilateral: Secondary | ICD-10-CM

## 2023-09-28 NOTE — Progress Notes (Signed)
Established Patient Office Visit  Subjective   Patient ID: Isabella Singh, female    DOB: October 01, 1981  Age: 42 y.o. MRN: [id number]  Chief Complaint  Patient presents with   Ear Pain    Pt c/o bilateral ear pain that started 5 days ago.    Patient arrives today for acute visit for the above.  She has a PMH significant for ADHD, asthma, multinodular goiter.  She has noticed some mild intermittent discomfort of bilateral ears over the last 5 or so longer. She also has a sensation of fullness of both ears, tinnitus, and some hearing loss. Reports having had Covid 19 infection a few weeks ago preceding her symptoms. Also recently traveled to [place] but did not swim in the ocean. She has seen ENT (Dr. Suszanne Conners) in the past but was unable to get in to see them for today. She reports having tried to lavage her ear at home with drops. She has a device in which she can visualize/take a picture of her ear canal at home. She is concerned about an area in her right ear canal that looks like wax vs tumor vs scab in her ear. She is concerned about injury and reports that her grandmother had a tumor in her ear in the past.      Review of Systems  HENT:  Positive for ear pain, hearing loss and tinnitus.       Objective:     BP 100/60   Pulse 86   Temp 98.4 F (36.9 C)   Ht 5\' 6"  (1.676 m)   Wt 156 lb 3.2 oz (70.9 kg)   SpO2 99%   BMI 25.21 kg/m    Physical Exam Vitals reviewed.  Constitutional:      General: She is not in acute distress.    Appearance: Normal appearance.  HENT:     Head: Normocephalic and atraumatic.     Right Ear: Hearing and tympanic membrane normal. Tympanic membrane is not erythematous, retracted or bulging.     Left Ear: Hearing, tympanic membrane, ear canal and external ear normal.     Ears:     Comments: Cerumen vs. Mass noted in canal without impaction noted to right ear, some irritation as evidenced by redness noted in medial ear canal (likely from at-home  lavage attempts) Pulmonary:     Effort: Pulmonary effort is normal.  Skin:    General: Skin is warm and dry.  Neurological:     General: No focal deficit present.     Mental Status: She is alert and oriented to person, place, and time.  Psychiatric:        Mood and Affect: Mood normal.        Behavior: Behavior normal.        Judgment: Judgment normal.      No results found for any visits on 09/28/23.    The ASCVD Risk score (Arnett DK, et al., 2019) failed to calculate for the following reasons:   Cannot find a previous HDL lab   Cannot find a previous total cholesterol lab    Assessment & Plan:   Problem List Items Addressed This Visit       Other   Otalgia of both ears - Primary    Acute Gentle ear lavage completed on right ear, patient tolerated well. Area that appears consistent with wax was not removed with lavage. Etiology unclear. I am not sure if this is a mass, injury, or hard wax that  was not cleared. Will recommend referral to ENT for evaluation. Referral ordered today.   No evidence of effusion or infection noted. Due to sensation of fluid in ear/fullness recommended patient trial over the counter antihistamine and Flonase nasal spray daily for a week or two while waiting to see Dr. Suszanne Conners.      Relevant Orders   Ambulatory referral to ENT    Return if symptoms worsen or fail to improve.    Elenore Paddy, NP

## 2023-09-28 NOTE — Patient Instructions (Signed)
Try a daily over the counter antihistamine such as zyrtec OR allegra.  Try flonase nasal spray daily.  Take these over the counter medications daily for a week or two to see if your symptoms improve.   Will also recommend that you follow-up with ENT.

## 2023-09-28 NOTE — Assessment & Plan Note (Addendum)
Acute Gentle ear lavage completed on right ear, patient tolerated well. Area that appears consistent with wax was not removed with lavage. Etiology unclear. I am not sure if this is a mass, injury, or hard wax that was not cleared. Will recommend referral to ENT for evaluation. Referral ordered today.   No evidence of effusion or infection noted. Due to sensation of fluid in ear/fullness recommended patient trial over the counter antihistamine and Flonase nasal spray daily for a week or two while waiting to see Dr. Suszanne Conners.

## 2023-10-06 DIAGNOSIS — M5481 Occipital neuralgia: Secondary | ICD-10-CM | POA: Insufficient documentation

## 2023-10-14 ENCOUNTER — Encounter (INDEPENDENT_AMBULATORY_CARE_PROVIDER_SITE_OTHER): Payer: Self-pay | Admitting: Otolaryngology

## 2023-10-21 ENCOUNTER — Other Ambulatory Visit: Payer: Self-pay | Admitting: Physician Assistant

## 2023-10-21 ENCOUNTER — Encounter: Payer: Self-pay | Admitting: Physician Assistant

## 2023-10-21 DIAGNOSIS — F909 Attention-deficit hyperactivity disorder, unspecified type: Secondary | ICD-10-CM

## 2023-10-21 DIAGNOSIS — R5382 Chronic fatigue, unspecified: Secondary | ICD-10-CM

## 2023-10-21 NOTE — Telephone Encounter (Signed)
Patient requesting to switch back to Adderall; please see note from pharmacy on refill and advise

## 2023-10-21 NOTE — Telephone Encounter (Signed)
Please see pt msg in regards to RX request previously sent this morning and advise

## 2023-10-24 ENCOUNTER — Other Ambulatory Visit: Payer: Self-pay | Admitting: Physician Assistant

## 2023-10-24 ENCOUNTER — Telehealth (INDEPENDENT_AMBULATORY_CARE_PROVIDER_SITE_OTHER): Payer: Self-pay | Admitting: Physician Assistant

## 2023-10-24 ENCOUNTER — Telehealth: Payer: 59 | Admitting: Physician Assistant

## 2023-10-24 MED ORDER — AMPHETAMINE-DEXTROAMPHET ER 15 MG PO CP24: 15.0000 mg | ORAL_CAPSULE | ORAL | 0 refills | Status: DC

## 2023-10-24 NOTE — Telephone Encounter (Signed)
Patient confirmed location and appointment time

## 2023-10-25 ENCOUNTER — Encounter (INDEPENDENT_AMBULATORY_CARE_PROVIDER_SITE_OTHER): Payer: Self-pay

## 2023-10-25 ENCOUNTER — Ambulatory Visit (INDEPENDENT_AMBULATORY_CARE_PROVIDER_SITE_OTHER): Payer: 59 | Admitting: Audiology

## 2023-10-25 ENCOUNTER — Ambulatory Visit (INDEPENDENT_AMBULATORY_CARE_PROVIDER_SITE_OTHER): Payer: 59 | Admitting: Physician Assistant

## 2023-10-25 DIAGNOSIS — M542 Cervicalgia: Secondary | ICD-10-CM

## 2023-10-25 DIAGNOSIS — Z0110 Encounter for examination of ears and hearing without abnormal findings: Secondary | ICD-10-CM | POA: Diagnosis not present

## 2023-10-25 DIAGNOSIS — H9193 Unspecified hearing loss, bilateral: Secondary | ICD-10-CM | POA: Diagnosis not present

## 2023-10-25 NOTE — Progress Notes (Signed)
  [address], [address] [place], [place] [address] [phone]  Audiological Evaluation    Name: Isabella Singh     DOB:   26-Apr-1981      MRN:   [id number]                                                                                     Service Date: 10/25/2023       Patient was referred today for a hearing evaluation by Eyvonne Mechanic, PA-C.  Symptoms Yes Details  Hearing loss  [x]  Patient reported perceiving bilateral hearing loss.  Tinnitus  [x]  Patient reported experiencing right ear tinnitus.  Balance problems  [x]  Patient reported vertigo sensations with elevators and stairs.  Previous ear surgeries  []  Patient denied any previous ear surgeries.  Amplification  []  Patient denied the use of hearing aids.    Tympanogram: Right ear: Normal external ear canal volume with normal middle ear pressure and tympanic membrane compliance (Type A). Left ear: Normal external ear canal volume with normal middle ear pressure and tympanic membrane compliance (Type A).    Hearing Evaluation: The audiogram was completed using conventional audiometric techniques under headphones with good reliability.   The hearing test results indicate: Right ear: Normal hearing sensitivity from [phone]. Left ear: Normal hearing sensitivity from [phone]. Slight asymmetry noted at 8000 Hz, where the right ear is worse than the left ear.  Speech Audiometry: Right ear- Speech Reception Threshold (SRT) was obtained at 10 dBHL. Left ear- Speech Reception Threshold (SRT) was obtained at 10 dBHL.   Word Recognition Score Tested using NU-6 (MLV) Right ear: 100% was obtained at a presentation level of 50 dBHL which is deemed as excellent understanding. Left ear: 100% was obtained at a presentation level of 50 dBHL which is deemed as excellent understanding.    Impression:  There is not a significant difference between puretone thresholds and word recognition scores between  ears.   Recommendations: Repeat audiogram when changes are perceived or per provider. Consider various tinnitus strategies, including the use of a noise generator, hearing aids, or tinnitus retraining therapy.   Conley Rolls Amani Marseille, AUD, CCC-A 10/25/23

## 2023-10-25 NOTE — Progress Notes (Signed)
Dear Dr. Wallace Cullens, Here is my assessment for our mutual patient, Isabella Singh. Thank you for allowing me the opportunity to care for your patient. Please do not hesitate to contact me should you have any other questions. Sincerely, Burna Forts PA-C  Otolaryngology Clinic Note Referring provider: Dr. Wallace Cullens HPI:  Isabella Singh is a 42 y.o. female kindly referred by Dr. Wallace Cullens for evaluation of ear foreign body.  The patient notes that approximately 3 weeks ago she felt as if there was something in her right ear, she noted some swelling.  She was uncertain if this was a mass versus cerumen.  She attempted to irrigate this at home, and then followed up with her primary care provider who also attempted to remove the area of concern.  They were unable to do this.  The patient does note that she feels like the area has improved.  She does have a family history of an ear mass in her grandmother.  She denies any infectious signs or symptoms, she denies any drainage.  She notes that she has had some more sensitivity to sound and does feel like she might have some hearing loss.  She also notes a history of ongoing neck pain.  She notes this has been going on for several years, this started after having an artificial disks placed in between C5-6 in 2018.  She notes this is pain under the left jaw and neck, she denies any significant swelling or redness.  She had workup for this by Dr. Suszanne Conners several years ago, she notes several CT scans which did not reveal any significant findings.  She denies any significant changes to the quality or characteristics of her ongoing issue.  She does have a significant past medical history of thyroiditis as well.  She notes that occasionally she does have buzzing in her ears, this is not persistent and is somewhat new.  She notes a remote history of what she describes as pulsatile tinnitus, this does not go with her heartbeat it is intermittent nonsustained.  Additionally she notes some pain in the  neck laterally and has been diagnosed with Eagle syndrome and is planning on further workup from a skull base surgeon.  She also notes pain at the left TMJ, she has seen oral surgery for this, she notes that she is using invisible line which seems to help, she does not have any other oral appliances for this.   H&N Surgery: Artificial disc C5-6 Personal or FHx of bleeding dz or anesthesia difficulty: no    Independent Review of Additional Tests or Records:  CT soft tissue neck 08/07/2019, ultrasound thyroid 01/11/2020  Tympanogram: Right ear:  (Type A). Left ear: (Type A).  The hearing test results indicate: Right ear: Normal hearing sensitivity from [phone]. Left ear: Normal hearing sensitivity from [phone] Hz. Slight asymmetry noted at 8000 Hz, where the right ear is worse than the left ear.   Speech Audiometry: Right ear- Speech Reception Threshold (SRT) was obtained at 10 dBHL. Left ear- Speech Reception Threshold (SRT) was obtained at 10 dBHL.   Word Recognition Score Tested using NU-6 (MLV) Right ear: 100% was obtained at a presentation level of 50 dBHL which is deemed as excellent understanding. Left ear: 100% was obtained at a presentation level of 50 dBHL which is deemed as excellent understanding.    PMH/Meds/All/SocHx/FamHx/ROS:   Past Medical History:  Diagnosis Date   ADHD (attention deficit hyperactivity disorder)    Asthma    B12 deficiency    "  borderline"- gets B12 injections   Bradycardia    after surgery in 2011 at night when sleeping   Elevated antinuclear antibody (ANA) level    Endometriosis    sx 2008 (x1), 2018 (x2)   Epstein Barr infection    hx   Family history of adverse reaction to anesthesia    sisters "wake up too early"   Low blood pressure    exercise and stress induced hypotension   Mitral valve prolapse    with regurgitation, last looked at in 2013   Multinodular goiter (nontoxic)    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    Pneumonia    walkign PNA in 2007     Past Surgical History:  Procedure Laterality Date   APPENDECTOMY  10/2017   ENDOMETRIAL ABLATION     with fallopian tube cystectomy 2008   EXCISION OF ENDOMETRIOMA  10/2017   LUMBAR LAMINECTOMY/DECOMPRESSION MICRODISCECTOMY Left 07/02/2022   Procedure: Microdiscectomy Lumbar four-five Left;  Surgeon: Jene Every, MD;  Location: [facility] OR;  Service: Orthopedics;  Laterality: Left;   NECK SURGERY  07/2017   c5 c6   PARTIAL HYSTERECTOMY  05/2017    Family History  Problem Relation Age of Onset   Allergies Mother    Arthritis Mother    Asthma Mother    Headache Mother    Headache Father    Headache Sister    Allergies Sister    Arthritis Sister    Allergies Brother    Headache Brother    Stroke Maternal Grandmother    Stroke Paternal Grandmother    Tuberculosis Other    Thyroid disease Neg Hx      Social Connections: Unknown (04/12/2022)   Received from [facility]   Social Network    Social Network: Not on file      Current Outpatient Medications:    albuterol (PROVENTIL) (2.5 MG/3ML) 0.083% nebulizer solution, Take 3 mLs (2.5 mg total) by nebulization every 6 (six) hours as needed for wheezing or shortness of breath., Disp: 150 mL, Rfl: 1   albuterol (VENTOLIN HFA) 108 (90 Base) MCG/ACT inhaler, Inhale 2 puffs into the lungs every 6 (six) hours as needed for wheezing or shortness of breath., Disp: 8 g, Rfl: 1   amphetamine-dextroamphetamine (ADDERALL XR) 15 MG 24 hr capsule, Take 1 capsule by mouth every morning., Disp: 30 capsule, Rfl: 0   amphetamine-dextroamphetamine (ADDERALL) 15 MG tablet, Take 1 tablet by mouth daily before breakfast., Disp: 30 tablet, Rfl: 0   amphetamine-dextroamphetamine (ADDERALL) 15 MG tablet, Take 1 tablet by mouth daily before breakfast., Disp: 30 tablet, Rfl: 0   amphetamine-dextroamphetamine (ADDERALL) 15 MG tablet, Take 1 tablet by mouth daily before breakfast., Disp: 30 tablet, Rfl:  0   celecoxib (CELEBREX) 200 MG capsule, Take 200 mg by mouth daily., Disp: , Rfl:    Cyanocobalamin (B-12 PO), Take 1 capsule by mouth daily., Disp: , Rfl:    methylphenidate (CONCERTA) 27 MG PO CR tablet, Take 1 tablet (27 mg total) by mouth daily before breakfast., Disp: 30 tablet, Rfl: 0   methylphenidate (CONCERTA) 27 MG PO CR tablet, Take 1 tablet (27 mg total) by mouth daily before breakfast. (Patient not taking: Reported on 08/16/2023), Disp: 30 tablet, Rfl: 0   methylphenidate (CONCERTA) 27 MG PO CR tablet, Take 1 tablet (27 mg total) by mouth daily before breakfast., Disp: 30 tablet, Rfl: 0   Probiotic Product (PROBIOTIC PO), Take 1 capsule by mouth daily., Disp: , Rfl:  Physical Exam:   There were no vitals taken for this visit.  Pertinent Findings  CN II-XII intact  Bilateral EAC clear and TM intact with well pneumatized middle ear spaces; minimal skin changes along the inferior portion EAC right, no surrounding redness discharge Weber 512: Equal Rinne 512: AC > BC b/l  Anterior rhinoscopy: Septum midline No lesions of oral cavity/oropharynx; dentition  No obviously palpable neck masses/lymphadenopathy/thyromegaly, tenderness along the left submandibular gland, no warmth, no surrounding redness, intraoral exam shows no purulence or tenderness to floor of mouth, no obvious stones No respiratory distress or stridor  Seprately Identifiable Procedures:  None  Impression & Plans:  Leen Trippel is a 42 y.o. female   Right ear pain-  The patient's right ear looks healthy, she does have an area of previous irritation that was likely the source of her symptoms, fortunately this looks well now with no signs of infection, mass or foreign body.  No treatment required at this time.  Hearing loss- No significant hearing loss identified on audiogram continue to monitor  Neck/jaw pain- This has previously been worked up with multiple CT scans with no significant findings, this continues  to cause some level of concern for the patient.  We elected to proceed with an ultrasound.  I do have suspicion that the area she is feeling is actually the submandibular gland, there are no signs of infection, this was bothering her at the time of her CT scans which were reassuring.  We will follow-up on ultrasound to assure no abnormal lymph nodes.  Tinnitus- She describes some nonpulsatile tinnitus that does not seem to be terribly bothersome.  No concerning features of this, no significant hearing loss at this time.     - f/u follow-up 2 weeks after ultrasound is complete   Thank you for allowing me the opportunity to care for your patient. Please do not hesitate to contact me should you have any other questions.  Sincerely, Burna Forts PA-C [facility] Phone: [phone] Fax: [phone]  10/25/2023, 8:39 AM

## 2023-11-02 ENCOUNTER — Telehealth: Payer: 59 | Admitting: Physician Assistant

## 2023-12-06 ENCOUNTER — Other Ambulatory Visit: Payer: Self-pay | Admitting: Physician Assistant

## 2023-12-06 NOTE — Telephone Encounter (Signed)
Last OV: 09/28/23  Next OV: none scheduled   Last Filled: 10/24/23  Quantity: 30

## 2023-12-09 ENCOUNTER — Other Ambulatory Visit: Payer: Self-pay | Admitting: Physician Assistant

## 2023-12-12 NOTE — Telephone Encounter (Signed)
 Last OV: 08/16/23  Next OV: 01/06/24  Last Filled: 10/24/23  Quantity:  30

## 2023-12-19 ENCOUNTER — Other Ambulatory Visit: Payer: Self-pay | Admitting: Physician Assistant

## 2023-12-20 NOTE — Telephone Encounter (Signed)
 Please see patient note regarding prescription; and advise patient. Schedule an appt?

## 2023-12-20 NOTE — Telephone Encounter (Signed)
 Last OV: 08/16/23  Next OV: 01/05/24  Last Filled: 10/24/23  Quantity: 30

## 2023-12-21 MED ORDER — AMPHETAMINE-DEXTROAMPHET ER 15 MG PO CP24: 15.0000 mg | ORAL_CAPSULE | ORAL | 0 refills | Status: DC

## 2023-12-22 ENCOUNTER — Ambulatory Visit: Payer: 59 | Admitting: Physician Assistant

## 2023-12-22 VITALS — BP 110/69 | HR 86 | Temp 98.2°F | Ht 66.0 in | Wt 161.6 lb

## 2023-12-22 DIAGNOSIS — R1011 Right upper quadrant pain: Secondary | ICD-10-CM | POA: Diagnosis not present

## 2023-12-22 DIAGNOSIS — M542 Cervicalgia: Secondary | ICD-10-CM | POA: Diagnosis not present

## 2023-12-22 DIAGNOSIS — F909 Attention-deficit hyperactivity disorder, unspecified type: Secondary | ICD-10-CM

## 2023-12-22 NOTE — Progress Notes (Signed)
Patient ID: Isabella Singh, female    DOB: 04-29-1981, 43 y.o.   MRN: [id number]   Assessment & Plan:  Attention deficit hyperactivity disorder (ADHD), unspecified ADHD type  RUQ abdominal pain -     US ABDOMEN LIMITED RUQ (LIVER/GB); Future  Neck pain   Assessment and Plan    Abdominal Pain Recurrent episodes of upper right quadrant pain, bloating, and changes in bowel movements. History of diverticulosis and endometriosis. Pain episodes may be related to gallbladder or gastrointestinal issues. -Order abdominal ultrasound to investigate cause of pain.  Chronic Constipation Regular bowel movements but reports of hard, dry stool with ridges. History of diverticulosis. -Continue current management strategies including increased water intake, fiber, and tea.  ADHD Stable on current dose of Adderall XR 15mg  daily. No issues with sleep. -Continue Adderall 15mg  daily. -Schedule follow-up in 3 months for medication check.  Neck Pain Chronic neck pain managed with daily Celebrex. Patient has seen specialists and is awaiting insurance approval for nerve block. -Continue Celebrex daily with caution due to potential gastrointestinal & cardiac side effects. -Encourage patient to pursue insurance appeal for nerve block.     Return in about 3 months (around 03/21/2024) for recheck/follow-up.    Subjective:    Chief Complaint  Patient presents with   ADHD    Pt in office to discuss medication and refills for ADHD medication;     HPI Discussed the use of AI scribe software for clinical note transcription with the patient, who gave verbal consent to proceed.  History of Present Illness   The patient, with a history of endometriosis, diverticulosis, and a previous neck surgery, presents with ongoing gastrointestinal issues. She reports episodes of severe abdominal pain and bloating, occurring approximately four times a year, with the most recent episode a few days ago. The pain is  predominantly located in the upper right quadrant, below the right rib. During these episodes, the patient experiences difficulty walking and talking, and shortness of breath. The episodes usually resolve by the end of the day with the intake of supplements, water, and tea.  The patient also reports changes in bowel movements, with frequent bowel movements daily. However, following episodes of abdominal pain, the patient notes inconsistencies in her stool, alternating between loose and hard, dry stools. The patient has been managing these symptoms with increased fiber intake, drinking more water, and taking supplements such as spirulina and broccoli sprouts.  The patient is currently on Adderall for sleepiness and reports that it is effective at the current dose. She also takes Celebrex daily for headaches and neck pain, which she would like to wean off if possible. The patient also mentions a history of endometriosis and diverticulosis, and a previous neck surgery.        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   Pneumonia    walkign PNA in 2007    Past Surgical History:  Procedure Laterality Date   APPENDECTOMY  10/2017   ENDOMETRIAL ABLATION     with fallopian tube cystectomy 2008   EXCISION OF ENDOMETRIOMA  10/2017   LUMBAR LAMINECTOMY/DECOMPRESSION MICRODISCECTOMY Left 07/02/2022    Procedure: Microdiscectomy Lumbar four-five Left;  Surgeon: Jene Every, MD;  Location: [facility] OR;  Service: Orthopedics;  Laterality: Left;   NECK SURGERY  07/2017   c5 c6   PARTIAL HYSTERECTOMY  05/2017    Family History  Problem Relation Age of Onset   Allergies Mother    Arthritis Mother    Asthma Mother    Headache Mother    Headache Father    Headache Sister    Allergies Sister    Arthritis Sister    Allergies Brother    Headache Brother    Stroke Maternal Grandmother    Stroke Paternal Grandmother    Tuberculosis Other    Thyroid disease Neg Hx     Social History   Tobacco Use   Smoking status: Never   Smokeless tobacco: Never  Vaping Use   Vaping status: Never Used  Substance Use Topics   Alcohol use: Not Currently    Comment: occasion   Drug use: Never     Allergies  Allergen Reactions   Cyclobenzaprine Rash   Sulfa Antibiotics Rash   Egg White (Egg Protein)     Stomach pain   Gluten Meal Nausea Only, Other (See Comments) and Nausea And Vomiting    GI Upset (intolerance), Lethargy (intolerance), Malaise (intolerance)   Mango Flavor [Flavoring Agent (Non-Screening)]     Mouth sore and lips get blisters   Mangifera Indica Other (See Comments)    Mouth Sores    Review of Systems NEGATIVE UNLESS OTHERWISE INDICATED IN HPI      Objective:     BP 110/69 (BP Location: Left Arm, Patient Position: Sitting)   Pulse 86   Temp 98.2 F (36.8 C) (Temporal)   Ht 5\' 6"  (1.676 m)   Wt 161 lb 9.6 oz (73.3 kg)   SpO2 97%   BMI 26.08 kg/m   Wt Readings from Last 3 Encounters:  12/22/23 161 lb 9.6 oz (73.3 kg)  09/28/23 156 lb 3.2 oz (70.9 kg)  04/06/23 140 lb (63.5 kg)    BP Readings from Last 3 Encounters:  12/22/23 110/69  09/28/23 100/60  12/11/22 103/68     Physical Exam Constitutional:      Appearance: Normal appearance.  Eyes:     Extraocular Movements: Extraocular movements intact.     Conjunctiva/sclera: Conjunctivae normal.      Pupils: Pupils are equal, round, and reactive to light.  Cardiovascular:     Rate and Rhythm: Normal rate and regular rhythm.     Pulses: Normal pulses.     Heart sounds: No murmur heard. Pulmonary:     Effort: Pulmonary effort is normal.     Breath sounds: Normal breath sounds.  Abdominal:     General: Abdomen is flat. Bowel sounds are normal. There is no distension.     Palpations: Abdomen is soft. There is no mass.     Tenderness: There is abdominal tenderness (RUQ dull minimal pain). There is no right CVA tenderness, left CVA tenderness, guarding or rebound.  Neurological:     Mental Status: She is alert.  Psychiatric:        Mood  and Affect: Mood normal.        Behavior: Behavior normal.         Garnet Overfield M Liz Pinho, PA-C

## 2024-01-06 ENCOUNTER — Encounter: Payer: 59 | Admitting: Physician Assistant

## 2024-01-10 ENCOUNTER — Inpatient Hospital Stay: Admission: RE | Admit: 2024-01-10 | Payer: 59 | Source: Ambulatory Visit

## 2024-01-12 ENCOUNTER — Other Ambulatory Visit: Payer: 59

## 2024-01-13 ENCOUNTER — Encounter: Payer: Self-pay | Admitting: Physician Assistant

## 2024-01-13 ENCOUNTER — Ambulatory Visit
Admission: RE | Admit: 2024-01-13 | Discharge: 2024-01-13 | Disposition: A | Discharge status: Home / Self Care | Payer: 59 | Source: Ambulatory Visit | Attending: Physician Assistant | Admitting: Physician Assistant

## 2024-01-13 DIAGNOSIS — R1011 Right upper quadrant pain: Secondary | ICD-10-CM

## 2024-01-23 ENCOUNTER — Other Ambulatory Visit: Payer: Self-pay | Admitting: Physician Assistant

## 2024-01-24 NOTE — Telephone Encounter (Signed)
 Last OV: 12/22/23  Next OV: 01/30/24  Last Filled: 12/21/23  Quantity: 30

## 2024-01-30 ENCOUNTER — Encounter: Payer: 59 | Admitting: Physician Assistant

## 2024-02-14 ENCOUNTER — Encounter: Payer: Self-pay | Admitting: Physician Assistant

## 2024-02-16 NOTE — Telephone Encounter (Signed)
 Please contact patient Mom to give steps or best direction in activating minor child MyChart. Per provider to avoid confusion parent needs to activate to avoid messaging about other patients using her Mychart.

## 2024-02-21 ENCOUNTER — Encounter (INDEPENDENT_AMBULATORY_CARE_PROVIDER_SITE_OTHER): Payer: Self-pay | Admitting: Physician Assistant

## 2024-02-22 ENCOUNTER — Ambulatory Visit
Admission: RE | Admit: 2024-02-22 | Discharge: 2024-02-22 | Discharge status: Home / Self Care | Source: Ambulatory Visit | Attending: Physician Assistant

## 2024-02-22 ENCOUNTER — Other Ambulatory Visit: Payer: Self-pay | Admitting: Physician Assistant

## 2024-02-22 DIAGNOSIS — M542 Cervicalgia: Secondary | ICD-10-CM

## 2024-02-22 NOTE — Telephone Encounter (Signed)
 LOV:12/22/2023 NOV:N/A QUAT:30CAP LAST REFILL:01/24/2024

## 2024-04-02 ENCOUNTER — Encounter: Payer: Self-pay | Admitting: Physician Assistant

## 2024-04-02 ENCOUNTER — Telehealth (INDEPENDENT_AMBULATORY_CARE_PROVIDER_SITE_OTHER): Admitting: Physician Assistant

## 2024-04-02 VITALS — Ht 66.0 in | Wt 162.0 lb

## 2024-04-02 DIAGNOSIS — R5382 Chronic fatigue, unspecified: Secondary | ICD-10-CM

## 2024-04-02 DIAGNOSIS — M542 Cervicalgia: Secondary | ICD-10-CM | POA: Diagnosis not present

## 2024-04-02 DIAGNOSIS — J4520 Mild intermittent asthma, uncomplicated: Secondary | ICD-10-CM | POA: Diagnosis not present

## 2024-04-02 DIAGNOSIS — G8929 Other chronic pain: Secondary | ICD-10-CM

## 2024-04-02 DIAGNOSIS — F909 Attention-deficit hyperactivity disorder, unspecified type: Secondary | ICD-10-CM

## 2024-04-02 MED ORDER — AMPHETAMINE-DEXTROAMPHET ER 20 MG PO CP24: 20.0000 mg | ORAL_CAPSULE | Freq: Every day | ORAL | 0 refills | Status: DC

## 2024-04-02 MED ORDER — ALBUTEROL SULFATE HFA 108 (90 BASE) MCG/ACT IN AERS: 2.0000 | INHALATION_SPRAY | Freq: Four times a day (QID) | RESPIRATORY_TRACT | 1 refills | Status: DC | PRN

## 2024-04-02 NOTE — Progress Notes (Signed)
 Virtual Visit via Video Note  I connected with  Isabella Singh  on 04/02/24 at  9:00 AM EDT by a video enabled telemedicine application and verified that I am speaking with the correct person using two identifiers.  Location: Patient: home Provider: Nature conservation officer at [employer] Persons present: Patient and myself   I discussed the limitations of evaluation and management by telemedicine and the availability of in person appointments. The patient expressed understanding and agreed to proceed.   History of Present Illness:  Discussed the use of AI scribe software for clinical note transcription with the patient, who gave verbal consent to proceed.  History of Present Illness Isabella Singh is a 44 year old female who presents with increased soreness and fatigue.  She has experienced a flare-up of soreness and fatigue over the past week, which she attributes to running out of Celebrex for two to three days. She describes feeling 'extremely sore and tired' and 'extra sleepy.'  She is currently taking Celebrex 200 mg once daily for neck pain, although it is prescribed for twice daily use. She wants to eventually wean off Celebrex due to her digestive issues. She also takes gabapentin  in the morning for occipital neuralgia, which has been managed with an occipital nerve block that provided relief.  She has hypersomnia, which is usually well-managed, but she has been experiencing increased sleepiness. She is currently on Adderall 15 mg, which she feels is not adequately addressing her fatigue.  She has mild intermittent asthma and has requested a refill for her rescue inhaler, although she rarely needs to use it.  She is preparing to travel to [place] for her son Jonah's knee surgery, which has been delayed due to complications from an infection following a previous procedure. This situation has added stress and travel demands, which may be contributing to her current  symptoms.     Observations/Objective:   Gen: Awake, alert, no acute distress Resp: Breathing is even and non-labored Psych: calm/pleasant demeanor Neuro: Alert and Oriented x 3, + facial symmetry, speech is clear.   Assessment and Plan:  Assessment & Plan Neck pain Chronic neck pain managed with Celebrex. Recent flare possibly due to running out of Celebrex for a few days. Concerns about long-term Celebrex use due to gastrointestinal and cardiovascular risks. Considering alternative treatments like nortriptyline. Recent occipital nerve block provided relief. - Continue Celebrex 200 mg once daily as prescribed by [facility]. - Discuss potential transition to nortriptyline with [facility]. - Consider reducing Celebrex frequency in the future with medical guidance. - Monitor for gastrointestinal side effects.  Occipital neuralgia Occipital neuralgia managed with gabapentin  and recent occipital nerve block, which provided significant relief. - Continue gabapentin  as needed for occipital neuralgia.  Hypersomnia Hypersomnia generally well-managed with Adderall. Recent increase in fatigue and sleepiness possibly due to stress, travel, and dietary factors. Current dose of Adderall 15 mg may be insufficient. Discussed increasing Adderall to 20 mg, which is not considered a high dose, to address increased fatigue. - Increase Adderall to 20 mg daily. - Monitor response to increased Adderall dose. - Reassess Adderall dosage after return from [place].  Mild intermittent asthma Mild intermittent asthma, rarely requiring use of rescue inhaler. - Refill rescue inhaler prescription.    Follow Up Instructions:    I discussed the assessment and treatment plan with the patient. The patient was provided an opportunity to ask questions and all were answered. The patient agreed with the plan and demonstrated an  understanding of the instructions.   The patient was advised to call back or seek  an in-person evaluation if the symptoms worsen or if the condition fails to improve as anticipated.  Helem Reesor M Aadil Sur, PA-C

## 2024-04-09 ENCOUNTER — Telehealth (INDEPENDENT_AMBULATORY_CARE_PROVIDER_SITE_OTHER): Payer: Self-pay | Admitting: Physician Assistant

## 2024-04-09 NOTE — Telephone Encounter (Signed)
 Attempted to call Ms. Billing to discuss ultrasound results. No answer, left voicemail.

## 2024-05-07 ENCOUNTER — Other Ambulatory Visit: Payer: Self-pay | Admitting: Physician Assistant

## 2024-05-07 DIAGNOSIS — F909 Attention-deficit hyperactivity disorder, unspecified type: Secondary | ICD-10-CM

## 2024-05-07 NOTE — Telephone Encounter (Signed)
 Last OV: 04/02/24 VV  Next OV: None scheduled  Last Filled: 04/02/24  Quantity: 30 no refills

## 2024-06-12 ENCOUNTER — Other Ambulatory Visit: Payer: Self-pay | Admitting: Physician Assistant

## 2024-06-12 DIAGNOSIS — F909 Attention-deficit hyperactivity disorder, unspecified type: Secondary | ICD-10-CM

## 2024-06-12 NOTE — Telephone Encounter (Signed)
 Last OV: 04/02/24  Next OV: none scheduled  Last Filled: 05/07/24  Quantity: 30

## 2024-07-18 ENCOUNTER — Other Ambulatory Visit: Payer: Self-pay | Admitting: Physician Assistant

## 2024-07-18 ENCOUNTER — Other Ambulatory Visit: Payer: Self-pay | Admitting: Obstetrics and Gynecology

## 2024-07-18 DIAGNOSIS — F909 Attention-deficit hyperactivity disorder, unspecified type: Secondary | ICD-10-CM

## 2024-07-18 DIAGNOSIS — Z1231 Encounter for screening mammogram for malignant neoplasm of breast: Secondary | ICD-10-CM

## 2024-07-18 NOTE — Telephone Encounter (Signed)
 Last Visit: 12/22/23  Next Visit: none scheduled  Last Filled: 06/12/24  Quantity: 30  Pt not seen in over 6 months but unable to deny due to controlled medication

## 2024-07-20 ENCOUNTER — Telehealth (INDEPENDENT_AMBULATORY_CARE_PROVIDER_SITE_OTHER): Admitting: Physician Assistant

## 2024-07-20 DIAGNOSIS — R5382 Chronic fatigue, unspecified: Secondary | ICD-10-CM | POA: Diagnosis not present

## 2024-07-20 DIAGNOSIS — F909 Attention-deficit hyperactivity disorder, unspecified type: Secondary | ICD-10-CM

## 2024-07-20 MED ORDER — AMPHETAMINE-DEXTROAMPHET ER 20 MG PO CP24: 20.0000 mg | ORAL_CAPSULE | ORAL | 0 refills | Status: DC

## 2024-07-20 NOTE — Progress Notes (Signed)
   Virtual Visit via Video Note  I connected with  Isabella Singh  on 07/20/24 at  8:00 AM EDT by a video enabled telemedicine application and verified that I am speaking with the correct person using two identifiers.  Location: Patient: home Provider: Nature conservation officer at [employer] Persons present: Patient and myself   I discussed the limitations of evaluation and management by telemedicine and the availability of in person appointments. The patient expressed understanding and agreed to proceed.   History of Present Illness:  Discussed the use of AI scribe software for clinical note transcription with the patient, who gave verbal consent to proceed.  History of Present Illness Isabella Singh is a 43 year old female who presents for ADHD medication management.  She is currently taking Adderall XR 20 mg, which effectively manages her ADHD symptoms. She is considering whether to continue with Adderall or try Vyvanse, although she has not tried Vyvanse before and is due for a refill of Adderall.  She also takes Celebrex, prescribed following her back surgery. She is undergoing physical therapy and is attempting to reduce her reliance on Celebrex, though she still requires it at present.  Overall feels stable and has done well following-up with her specialists.     Observations/Objective:   Gen: Awake, alert, no acute distress Resp: Breathing is even and non-labored Psych: calm/pleasant demeanor Neuro: Alert and Oriented x 3, + facial symmetry, speech is clear.   Assessment and Plan:  Assessment & Plan Attention-deficit hyperactivity disorder (ADHD), chronic fatigue. ADHD is well-managed with Adderall XR 20 mg. She considered Vyvanse for smoother transition effects but is satisfied with Adderall's effectiveness. - Refill Adderall XR 20 mg prescription and send to [facility]. - Schedule follow-up appointment in three months, either virtual or in-person. - PDMP  reviewed today, no red flags, filling appropriately.      Follow Up Instructions:    I discussed the assessment and treatment plan with the patient. The patient was provided an opportunity to ask questions and all were answered. The patient agreed with the plan and demonstrated an understanding of the instructions.   The patient was advised to call back or seek an in-person evaluation if the symptoms worsen or if the condition fails to improve as anticipated.  Leather Estis M Leeza Heiner, PA-C

## 2024-08-09 ENCOUNTER — Ambulatory Visit
Admission: RE | Admit: 2024-08-09 | Discharge: 2024-08-09 | Disposition: A | Discharge status: Home / Self Care | Source: Ambulatory Visit | Attending: Obstetrics and Gynecology | Admitting: Obstetrics and Gynecology

## 2024-08-09 DIAGNOSIS — Z1231 Encounter for screening mammogram for malignant neoplasm of breast: Secondary | ICD-10-CM

## 2024-11-19 ENCOUNTER — Other Ambulatory Visit: Payer: Self-pay | Admitting: Physician Assistant

## 2024-11-19 DIAGNOSIS — R5382 Chronic fatigue, unspecified: Secondary | ICD-10-CM

## 2024-11-19 DIAGNOSIS — F909 Attention-deficit hyperactivity disorder, unspecified type: Secondary | ICD-10-CM

## 2024-11-19 NOTE — Telephone Encounter (Signed)
 Last OV: 07/20/24  Next OV: None  Last filled: 0/14/25  Quantity: 30

## 2024-11-23 ENCOUNTER — Other Ambulatory Visit: Payer: Self-pay | Admitting: Physician Assistant

## 2024-11-23 DIAGNOSIS — F909 Attention-deficit hyperactivity disorder, unspecified type: Secondary | ICD-10-CM

## 2024-11-23 DIAGNOSIS — R5382 Chronic fatigue, unspecified: Secondary | ICD-10-CM

## 2024-11-23 NOTE — Telephone Encounter (Signed)
 Previously denied due to pt needs appt this week, pt has been notified and appt being scheduled for 12/24 at 11:30 am. Pt asking for enough medication to get her to appt. Advised pt would send msg to PCP but reminded pt has to be seen every three months for this type of prescription. Pt verbalized understanding.

## 2024-11-28 ENCOUNTER — Ambulatory Visit: Admitting: Physician Assistant

## 2024-11-28 ENCOUNTER — Encounter: Payer: Self-pay | Admitting: Physician Assistant

## 2024-11-28 DIAGNOSIS — R140 Abdominal distension (gaseous): Secondary | ICD-10-CM | POA: Insufficient documentation

## 2024-11-28 DIAGNOSIS — R194 Change in bowel habit: Secondary | ICD-10-CM | POA: Insufficient documentation

## 2024-11-28 DIAGNOSIS — Z1211 Encounter for screening for malignant neoplasm of colon: Secondary | ICD-10-CM | POA: Insufficient documentation

## 2024-11-28 DIAGNOSIS — Z860100 Personal history of colon polyps, unspecified: Secondary | ICD-10-CM | POA: Insufficient documentation

## 2024-11-28 DIAGNOSIS — K5904 Chronic idiopathic constipation: Secondary | ICD-10-CM | POA: Insufficient documentation

## 2024-11-28 DIAGNOSIS — Z800 Family history of malignant neoplasm of digestive organs: Secondary | ICD-10-CM | POA: Insufficient documentation

## 2024-11-28 DIAGNOSIS — K5730 Diverticulosis of large intestine without perforation or abscess without bleeding: Secondary | ICD-10-CM | POA: Insufficient documentation

## 2024-11-28 DIAGNOSIS — R1011 Right upper quadrant pain: Secondary | ICD-10-CM | POA: Insufficient documentation

## 2024-11-28 DIAGNOSIS — R1032 Left lower quadrant pain: Secondary | ICD-10-CM | POA: Insufficient documentation

## 2024-11-28 DIAGNOSIS — K581 Irritable bowel syndrome with constipation: Secondary | ICD-10-CM | POA: Insufficient documentation

## 2024-11-28 DIAGNOSIS — R159 Full incontinence of feces: Secondary | ICD-10-CM | POA: Insufficient documentation

## 2024-11-28 DIAGNOSIS — N816 Rectocele: Secondary | ICD-10-CM | POA: Insufficient documentation

## 2024-11-28 DIAGNOSIS — K9041 Non-celiac gluten sensitivity: Secondary | ICD-10-CM | POA: Insufficient documentation

## 2024-11-28 DIAGNOSIS — R634 Abnormal weight loss: Secondary | ICD-10-CM | POA: Insufficient documentation

## 2024-11-28 DIAGNOSIS — R933 Abnormal findings on diagnostic imaging of other parts of digestive tract: Secondary | ICD-10-CM | POA: Insufficient documentation

## 2024-11-28 DIAGNOSIS — R1033 Periumbilical pain: Secondary | ICD-10-CM | POA: Insufficient documentation

## 2024-11-28 DIAGNOSIS — E739 Lactose intolerance, unspecified: Secondary | ICD-10-CM | POA: Insufficient documentation

## 2024-11-28 DIAGNOSIS — R152 Fecal urgency: Secondary | ICD-10-CM | POA: Insufficient documentation

## 2024-11-30 ENCOUNTER — Encounter: Payer: Self-pay | Admitting: Physician Assistant

## 2024-12-05 ENCOUNTER — Encounter: Payer: Self-pay | Admitting: Physician Assistant

## 2024-12-10 ENCOUNTER — Ambulatory Visit: Payer: Self-pay | Admitting: Physician Assistant

## 2024-12-10 ENCOUNTER — Encounter: Payer: Self-pay | Admitting: Physician Assistant

## 2024-12-10 VITALS — BP 116/70 | HR 77 | Temp 97.2°F | Ht 66.0 in | Wt 156.4 lb

## 2024-12-10 DIAGNOSIS — G8929 Other chronic pain: Secondary | ICD-10-CM

## 2024-12-10 DIAGNOSIS — M2550 Pain in unspecified joint: Secondary | ICD-10-CM

## 2024-12-10 DIAGNOSIS — R519 Headache, unspecified: Secondary | ICD-10-CM | POA: Diagnosis not present

## 2024-12-10 DIAGNOSIS — F909 Attention-deficit hyperactivity disorder, unspecified type: Secondary | ICD-10-CM

## 2024-12-10 DIAGNOSIS — Z87898 Personal history of other specified conditions: Secondary | ICD-10-CM | POA: Diagnosis not present

## 2024-12-10 DIAGNOSIS — Z8742 Personal history of other diseases of the female genital tract: Secondary | ICD-10-CM | POA: Diagnosis not present

## 2024-12-10 DIAGNOSIS — R5383 Other fatigue: Secondary | ICD-10-CM | POA: Diagnosis not present

## 2024-12-10 DIAGNOSIS — M542 Cervicalgia: Secondary | ICD-10-CM

## 2024-12-10 DIAGNOSIS — H9203 Otalgia, bilateral: Secondary | ICD-10-CM

## 2024-12-10 LAB — CBC WITH DIFFERENTIAL/PLATELET
Basophils Absolute: 0 K/uL (ref 0.0–0.1)
Basophils Relative: 0.5 % (ref 0.0–3.0)
Eosinophils Absolute: 0.2 K/uL (ref 0.0–0.7)
Eosinophils Relative: 2.7 % (ref 0.0–5.0)
HCT: 40.1 % (ref 36.0–46.0)
Hemoglobin: 13.6 g/dL (ref 12.0–15.0)
Lymphocytes Relative: 29.4 % (ref 12.0–46.0)
Lymphs Abs: 2.2 K/uL (ref 0.7–4.0)
MCHC: 34 g/dL (ref 30.0–36.0)
MCV: 90.5 fl (ref 78.0–100.0)
Monocytes Absolute: 0.5 K/uL (ref 0.1–1.0)
Monocytes Relative: 7.3 % (ref 3.0–12.0)
Neutro Abs: 4.4 K/uL (ref 1.4–7.7)
Neutrophils Relative %: 60.1 % (ref 43.0–77.0)
Platelets: 402 K/uL — ABNORMAL HIGH (ref 150.0–400.0)
RBC: 4.43 Mil/uL (ref 3.87–5.11)
RDW: 12.9 % (ref 11.5–15.5)
WBC: 7.4 K/uL (ref 4.0–10.5)

## 2024-12-10 LAB — C-REACTIVE PROTEIN: CRP: <0.5 mg/dL — ABNORMAL LOW (ref 1.0–20.0)

## 2024-12-10 LAB — COMPREHENSIVE METABOLIC PANEL WITH GFR
ALT: 14 U/L (ref 3–35)
AST: 15 U/L (ref 5–37)
Albumin: 4.4 g/dL (ref 3.5–5.2)
Alkaline Phosphatase: 66 U/L (ref 39–117)
BUN: 18 mg/dL (ref 6–23)
CO2: 28 meq/L (ref 19–32)
Calcium: 9.4 mg/dL (ref 8.4–10.5)
Chloride: 103 meq/L (ref 96–112)
Creatinine, Ser: 0.75 mg/dL (ref 0.40–1.20)
GFR: 96.78 mL/min
Glucose, Bld: 107 mg/dL — ABNORMAL HIGH (ref 70–99)
Potassium: 4.2 meq/L (ref 3.5–5.1)
Sodium: 138 meq/L (ref 135–145)
Total Bilirubin: 0.5 mg/dL (ref 0.2–1.2)
Total Protein: 7.3 g/dL (ref 6.0–8.3)

## 2024-12-10 LAB — SEDIMENTATION RATE: Sed Rate: 7 mm/h (ref 0–20)

## 2024-12-10 LAB — URINALYSIS, ROUTINE W REFLEX MICROSCOPIC
Bilirubin Urine: NEGATIVE
Hgb urine dipstick: NEGATIVE
Ketones, ur: NEGATIVE
Leukocytes,Ua: NEGATIVE
Nitrite: NEGATIVE
RBC / HPF: NONE SEEN
Specific Gravity, Urine: 1.02 (ref 1.000–1.030)
Total Protein, Urine: NEGATIVE
Urine Glucose: NEGATIVE
Urobilinogen, UA: 0.2 (ref 0.0–1.0)
pH: 6.5 (ref 5.0–8.0)

## 2024-12-10 LAB — IBC + FERRITIN
Ferritin: 60.4 ng/mL (ref 10.0–291.0)
Iron: 141 ug/dL (ref 42–145)
Saturation Ratios: 33.1 % (ref 20.0–50.0)
TIBC: 425.6 ug/dL (ref 250.0–450.0)
Transferrin: 304 mg/dL (ref 212.0–360.0)

## 2024-12-10 LAB — VITAMIN B12: Vitamin B-12: 513 pg/mL (ref 211–911)

## 2024-12-10 LAB — TSH: TSH: 1.05 u[IU]/mL (ref 0.35–5.50)

## 2024-12-10 LAB — VITAMIN D 25 HYDROXY (VIT D DEFICIENCY, FRACTURES): VITD: 38.78 ng/mL (ref 30.00–100.00)

## 2024-12-10 MED ORDER — AMPHETAMINE-DEXTROAMPHET ER 20 MG PO CP24: 20.0000 mg | ORAL_CAPSULE | Freq: Every day | ORAL | 0 refills | Status: AC

## 2024-12-10 NOTE — Progress Notes (Signed)
 "   Patient ID: Isabella Singh, female    DOB: [birth year], 44 y.o.   MRN: [id number]   Assessment & Plan:  Other fatigue -     VITAMIN D  25 Hydroxy (Vit-D Deficiency, Fractures) -     Vitamin B12 -     TSH -     Comprehensive metabolic panel with GFR -     CBC with Differential/Platelet -     IBC + Ferritin -     Urinalysis, Routine w reflex microscopic -     C-reactive protein -     Sedimentation rate -     ANA, IFA Comprehensive Panel -     Rheumatoid factor  Arthralgia of multiple sites -     C-reactive protein -     Sedimentation rate -     ANA, IFA Comprehensive Panel -     Rheumatoid factor  History of elevated antinuclear antibody (ANA) -     C-reactive protein -     Sedimentation rate -     ANA, IFA Comprehensive Panel -     Rheumatoid factor  Chronic neck pain -     Ambulatory referral to ENT  Frequent headaches -     Ambulatory referral to ENT -     TSH -     Comprehensive metabolic panel with GFR -     CBC with Differential/Platelet  Otalgia of both ears -     Ambulatory referral to ENT  Attention deficit hyperactivity disorder (ADHD), unspecified ADHD type -     Amphetamine -Dextroamphet ER; Take 1 capsule (20 mg total) by mouth daily.  Dispense: 30 capsule; Refill: 0  History of endometriosis      Assessment and Plan Assessment & Plan Evaluation for possible autoimmune connective tissue disease Persistent fatigue, joint aches, headaches, and Raynaud's phenomenon with recent flare-ups. Family history of arthritis and hypermobility syndrome. Previous high ANA levels. Differential includes Sjogren's syndrome, lupus, and other autoimmune conditions. Symptoms include flushing, swollen and stiff fingers, and mouth pain with sores. No recent rheumatology evaluation. - Ordered comprehensive panel including inflammatory markers (sed rate, CRP), rheumatoid factor, ANA, ferritin, thyroid  function, B12, vitamin D , and glucose. - Ordered CBC and urinalysis to check  for infection or other abnormalities. - Discuss colon cancer screening with gastroenterologist due to family history and symptoms.  Chronic neck pain with occipital neuralgia and jaw pain Chronic neck pain with occipital neuralgia and jaw pain. Previous occipital nerve block provided temporary relief. 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 Referral to head and neck specialist is pending. - Referred to head and neck specialist for further evaluation and management per patient request.   History of endometriosis and pelvic floor prolapse Endometriosis with previous surgeries and hysterectomy in 2018. Pelvic floor prolapse with bladder and rectocele. Symptoms include low back issues and different sensations. Coordination with endometriosis specialist and nerve doctor is needed before addressing pelvic floor issues. - Coordinate care with endometriosis specialist and nerve doctor before addressing pelvic floor issues.  Attention deficit hyperactivity disorder (ADHD) ADHD management with upcoming in-person appointment. Prescription for ADHD medication is pending. - Prescribed Adderall for 30 days and sent prescription to [facility]. - Follow up with psychiatry for ongoing management. - PDMP reviewed today, no red flags, filling appropriately.    General health maintenance Routine health maintenance discussed, including recent mammogram and potential colon cancer screening. - Continue routine health maintenance and screenings as recommended.  Return in about 3 months (around 03/10/2025) for recheck/follow-up.    Subjective:    Chief Complaint  Patient presents with   Joint Pain   Fatigue   Headache    This flare started about four weeks ago. Want to talk about a referral for her neck pain- in [place].     HPI Discussed the use of AI scribe software for clinical note transcription with the patient, who gave verbal consent to  proceed.  History of Present Illness Isabella Singh is a 44 year old female who presents with persistent fatigue, joint aches, and headaches.  She experiences persistent fatigue, joint aches, and headaches, with the headaches worsening over the past few months. She has a history of occipital neuralgia and received an occipital nerve branch block last year, which provided temporary relief. 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 She also experiences dystonia.  She describes flares of fatigue and joint pain, particularly in the mornings, making it difficult to wake up and function before 10 AM. These flares have been more persistent over the past four weeks. She has not seen a rheumatologist in a long time. She experiences Raynaud's phenomenon, with her fingers burning in cold weather, even when it is not extremely cold. Her fingers are swollen and stiff, causing pain with simple tasks.  She has a family history of arthritis and hypermobility syndrome. Her mother's cousin has rheumatoid arthritis, and her nephew has juvenile rheumatoid arthritis. Her sister was told she might have autoimmune bursitis. She has a history of high ANA levels and low B12, and she suspects she might have Sjogren's syndrome due to mouth pain, sores, and dry eyes.  She experiences flushing on her face, which she describes as constant and not necessarily related to sun exposure. She has a history of endometriosis and underwent a hysterectomy in 2018 due to severe symptoms. She also has pelvic floor prolapse. She experiences occasional gallbladder-like pain and has had a few gallbladder attacks in the past.  She has not had recent blood work, with the last tests being done in the ER last year. She is considering further evaluation with a gastroenterologist due to symptoms like powdery urine and mucus in her stool. She has a family history of colon cancer, with  her paternal uncle dying of the disease in his forties.     Past Medical History:  Diagnosis Date   Abdominal bloating 11/28/2024   Abnormal weight loss 11/28/2024   ADHD (attention deficit hyperactivity disorder)    Asthma    B12 deficiency    borderline- gets B12 injections   Bradycardia    after surgery in 2011 at night when sleeping   Elevated antinuclear antibody (ANA) level    Endometriosis    sx 2008 (x1), 2018 (x2)   Epstein Barr infection    hx   Family history of adverse reaction to anesthesia    sisters wake up too early   Fecal urgency 11/28/2024   Incontinence of feces 11/28/2024   Left lower quadrant pain 11/28/2024   Low blood pressure    exercise and stress induced hypotension   Mitral valve prolapse    with regurgitation, last looked at in 2013   Multinodular goiter (nontoxic)    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   Periumbilical pain 11/28/2024   Pneumonia    walkign PNA in 2007   Right upper quadrant pain 11/28/2024  Past Surgical History:  Procedure Laterality Date   APPENDECTOMY  10/2017   ENDOMETRIAL ABLATION     with fallopian tube cystectomy 2008   EXCISION OF ENDOMETRIOMA  10/2017   LUMBAR LAMINECTOMY/DECOMPRESSION MICRODISCECTOMY Left 07/02/2022   Procedure: Microdiscectomy Lumbar four-five Left;  Surgeon: Duwayne Purchase, MD;  Location: [facility] OR;  Service: Orthopedics;  Laterality: Left;   NECK SURGERY  07/2017   c5 c6   PARTIAL HYSTERECTOMY  05/2017    Family History  Problem Relation Age of Onset   Allergies Mother    Arthritis Mother    Asthma Mother    Headache Mother    Headache Father    Headache Sister    Allergies Sister    Arthritis Sister    Allergies Brother    Headache Brother    Stroke Maternal Grandmother    Stroke Paternal Grandmother    Tuberculosis Other    Colon cancer Paternal Uncle        passed in 24s   Thyroid  disease Neg Hx     Social History[1]   Allergies[2]  Review of  Systems NEGATIVE UNLESS OTHERWISE INDICATED IN HPI      Objective:     BP 116/70 (BP Location: Right Arm, Patient Position: Sitting, Cuff Size: Normal)   Pulse 77   Temp (!) 97.2 F (36.2 C) (Temporal)   Ht 5' 6 (1.676 m)   Wt 156 lb 6.4 oz (70.9 kg)   SpO2 99%   BMI 25.24 kg/m   Wt Readings from Last 3 Encounters:  12/10/24 156 lb 6.4 oz (70.9 kg)  04/02/24 162 lb (73.5 kg)  12/22/23 161 lb 9.6 oz (73.3 kg)    BP Readings from Last 3 Encounters:  12/10/24 116/70  12/22/23 110/69  09/28/23 100/60     Physical Exam Vitals and nursing note reviewed.  Constitutional:      Appearance: Normal appearance. She is normal weight. She is not toxic-appearing.  HENT:     Head: Normocephalic and atraumatic.     Right Ear: Tympanic membrane, ear canal and external ear normal.     Left Ear: Tympanic membrane, ear canal and external ear normal.     Nose: Nose normal.     Mouth/Throat:     Mouth: Mucous membranes are moist.  Eyes:     Extraocular Movements: Extraocular movements intact.     Conjunctiva/sclera: Conjunctivae normal.     Pupils: Pupils are equal, round, and reactive to light.  Cardiovascular:     Rate and Rhythm: Normal rate and regular rhythm.     Pulses: Normal pulses.     Heart sounds: Normal heart sounds.  Pulmonary:     Effort: Pulmonary effort is normal.     Breath sounds: Normal breath sounds.  Abdominal:     General: Abdomen is flat. Bowel sounds are normal.     Palpations: Abdomen is soft.  Musculoskeletal:        General: Normal range of motion.     Cervical back: Normal range of motion and neck supple.  Skin:    General: Skin is warm and dry.  Neurological:     General: No focal deficit present.     Mental Status: She is alert and oriented to person, place, and time.  Psychiatric:        Mood and Affect: Mood normal.        Behavior: Behavior normal.        Thought Content: Thought content normal.  Judgment: Judgment normal.              Deroy Noah M Devell Parkerson, PA-C     [1]  Social History Tobacco Use   Smoking status: Never   Smokeless tobacco: Never  Vaping Use   Vaping status: Never Used  Substance Use Topics   Alcohol use: Not Currently    Comment: occasion   Drug use: Never  [2]  Allergies Allergen Reactions   Cyclobenzaprine Rash and Dermatitis   Sulfa Antibiotics Rash and Other (See Comments)   Egg Protein (Egg White) Other (See Comments)    Stomach pain  egg white (chicken) allergenic extract   Gluten Meal Nausea Only, Other (See Comments) and Nausea And Vomiting    GI Upset (intolerance), Lethargy (intolerance), Malaise (intolerance)   Mango Flavor [Flavoring Agent (Non-Screening)]     Mouth sore and lips get blisters   Mangifera Indica Other (See Comments)    Mouth Sores  mango extract   "

## 2024-12-10 NOTE — Telephone Encounter (Signed)
She has an appt scheduled for today

## 2024-12-11 LAB — RHEUMATOID FACTOR: Rheumatoid fact SerPl-aCnc: <10 [IU]/mL

## 2024-12-12 LAB — ANTI-NUCLEAR AB-TITER (ANA TITER)
ANA TITER: 1:320 {titer} — ABNORMAL HIGH
ANA Titer 1: 1:80 {titer} — ABNORMAL HIGH

## 2024-12-12 LAB — ANA, IFA COMPREHENSIVE PANEL
Anti Nuclear Antibody (ANA): POSITIVE — AB
ENA SM Ab Ser-aCnc: <1 AI
SM/RNP: <1 AI
SSA (Ro) (ENA) Antibody, IgG: <1 AI
SSB (La) (ENA) Antibody, IgG: <1 AI
Scleroderma (Scl-70) (ENA) Antibody, IgG: <1 AI
ds DNA Ab: 1 [IU]/mL

## 2024-12-13 ENCOUNTER — Ambulatory Visit: Payer: Self-pay | Admitting: Physician Assistant

## 2024-12-14 ENCOUNTER — Ambulatory Visit: Payer: Self-pay | Admitting: Physician Assistant

## 2024-12-24 ENCOUNTER — Other Ambulatory Visit: Payer: Self-pay | Admitting: Physician Assistant
